# Patient Record
Sex: Female | Born: 1965 | Race: White | Hispanic: No | Marital: Married
Health system: Southern US, Community
[De-identification: ages and names within clinical notes are randomized; demographics above are authoritative.]

---

## 2004-04-27 ENCOUNTER — Emergency Department: Payer: Self-pay | Admitting: Unknown Physician Specialty

## 2004-05-29 ENCOUNTER — Emergency Department: Payer: Self-pay | Admitting: Emergency Medicine

## 2004-08-07 ENCOUNTER — Emergency Department: Payer: Self-pay | Admitting: Internal Medicine

## 2005-05-27 ENCOUNTER — Emergency Department: Payer: Self-pay | Admitting: Emergency Medicine

## 2006-02-06 ENCOUNTER — Emergency Department: Payer: Self-pay

## 2006-11-02 ENCOUNTER — Emergency Department: Payer: Self-pay | Admitting: Emergency Medicine

## 2006-11-17 ENCOUNTER — Ambulatory Visit: Payer: Self-pay | Admitting: Internal Medicine

## 2007-03-25 ENCOUNTER — Emergency Department: Payer: Self-pay | Admitting: Emergency Medicine

## 2007-04-29 ENCOUNTER — Emergency Department: Payer: Self-pay

## 2007-05-01 ENCOUNTER — Emergency Department: Payer: Self-pay | Admitting: Emergency Medicine

## 2007-05-01 ENCOUNTER — Other Ambulatory Visit: Payer: Self-pay

## 2007-05-02 ENCOUNTER — Other Ambulatory Visit: Payer: Self-pay

## 2007-05-23 ENCOUNTER — Emergency Department: Payer: Self-pay | Admitting: Emergency Medicine

## 2007-08-18 ENCOUNTER — Emergency Department: Payer: Self-pay | Admitting: Emergency Medicine

## 2007-08-18 ENCOUNTER — Other Ambulatory Visit: Payer: Self-pay

## 2007-11-03 ENCOUNTER — Emergency Department: Payer: Self-pay | Admitting: Emergency Medicine

## 2007-11-03 ENCOUNTER — Other Ambulatory Visit: Payer: Self-pay

## 2007-11-09 ENCOUNTER — Emergency Department: Payer: Self-pay | Admitting: Internal Medicine

## 2008-01-20 ENCOUNTER — Emergency Department: Payer: Self-pay | Admitting: Emergency Medicine

## 2008-02-17 ENCOUNTER — Emergency Department: Payer: Self-pay | Admitting: Emergency Medicine

## 2008-04-24 IMAGING — CR DG KNEE COMPLETE 4+V*R*
1 series · 5 of 5 positions shown · non-contrast
Comparison: none

REASON FOR EXAM: fall
COMMENTS:

PROCEDURE:     DXR - DXR KNEE RT COMP WITH OBLIQUES  - May 23, 2007  [DATE]
RESULT:     No fracture, dislocation or other acute bony abnormality is
identified.  The knee joint space is well maintained.  The patella is
intact. Incidental note is  made of slight dorsal patella spurring.

[Series 1: view not recorded · 0.17mm/px · 5 of 5 slices shown]
[im 1/5]
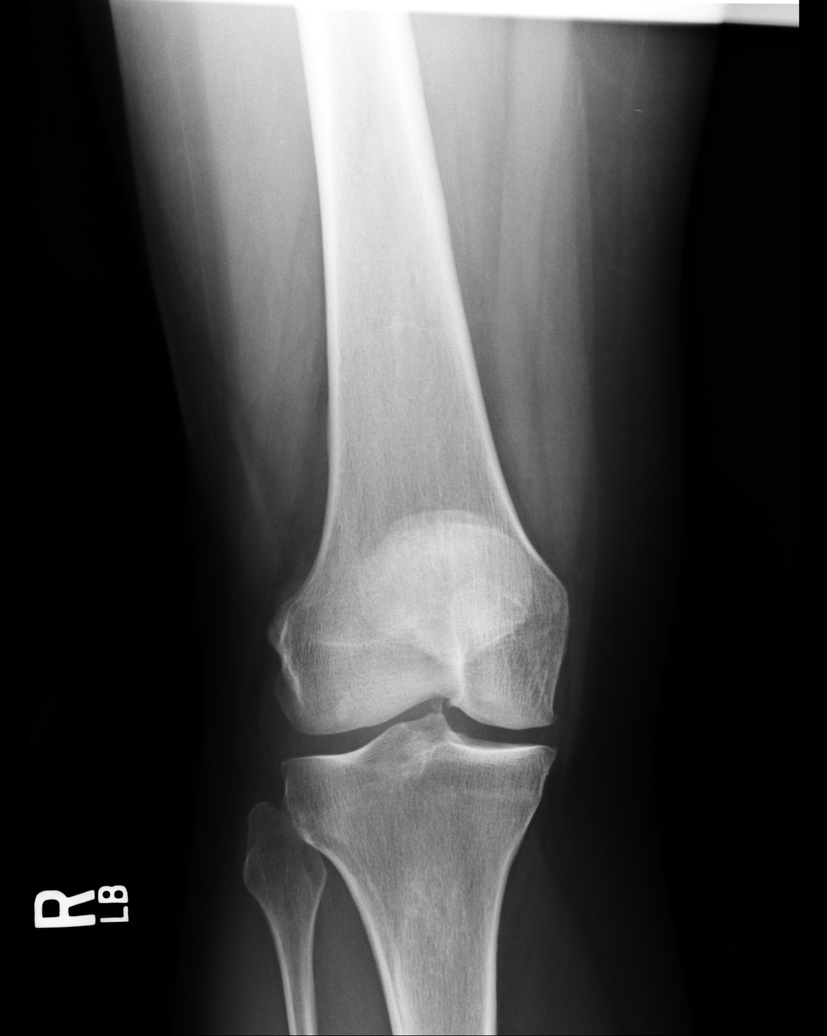
[im 2/5]
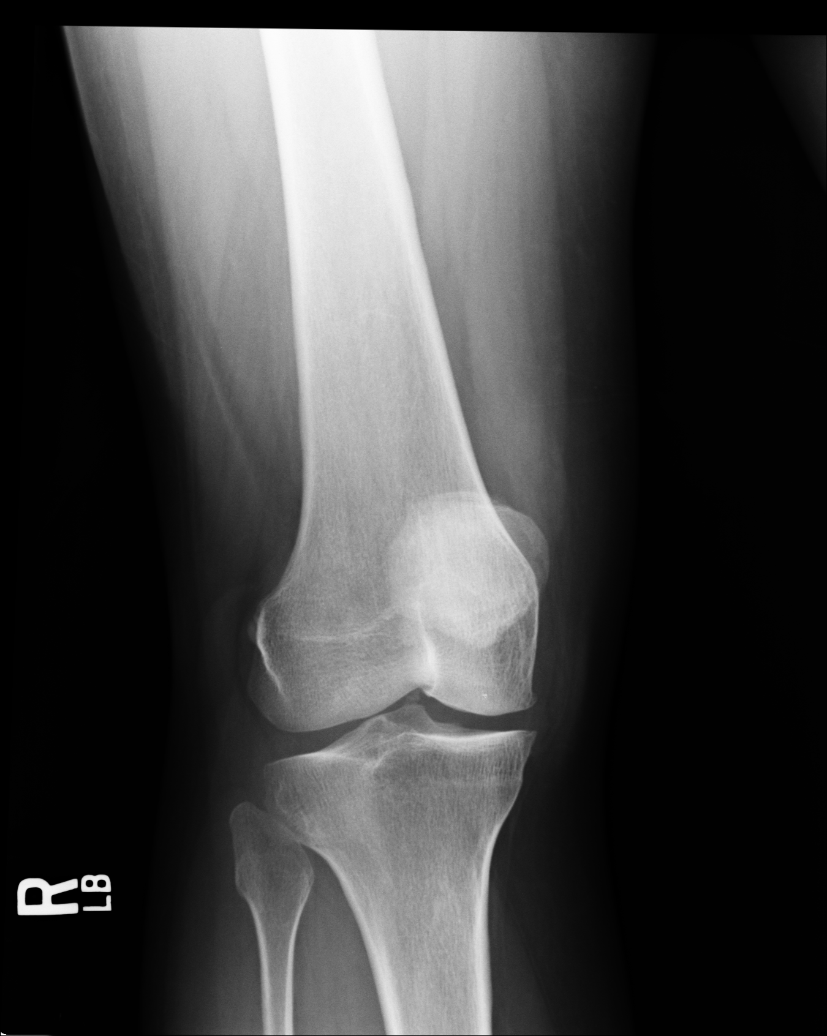
[im 3/5]
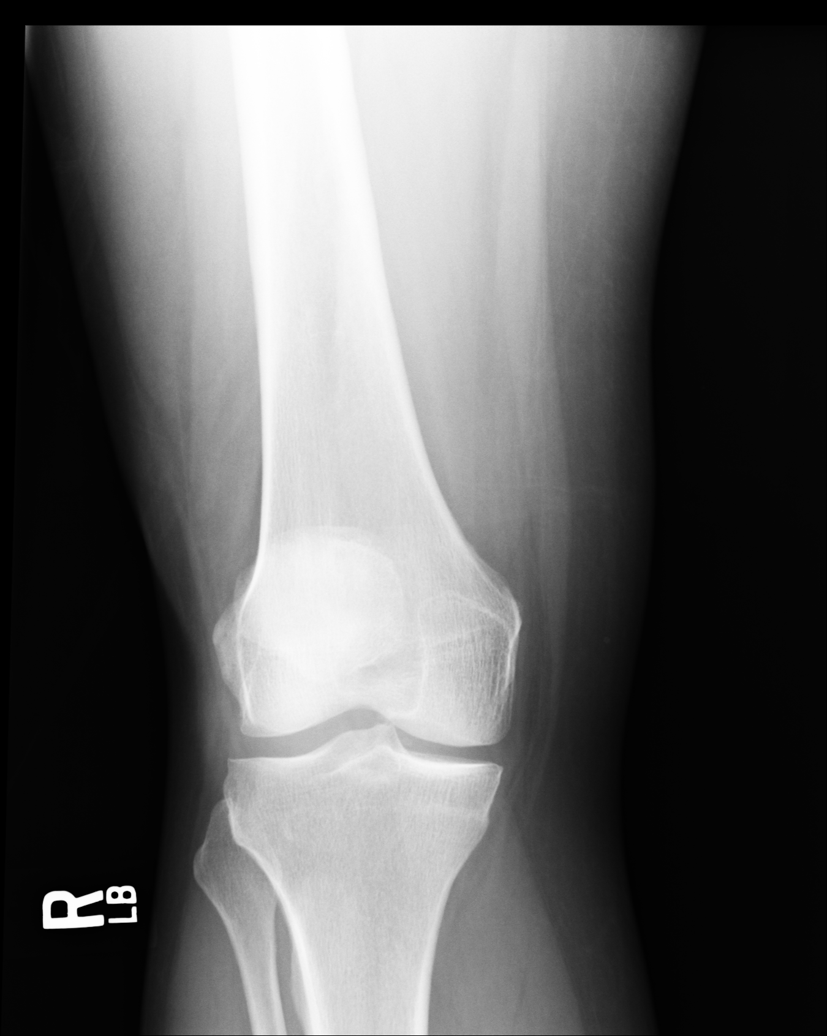
[im 4/5]
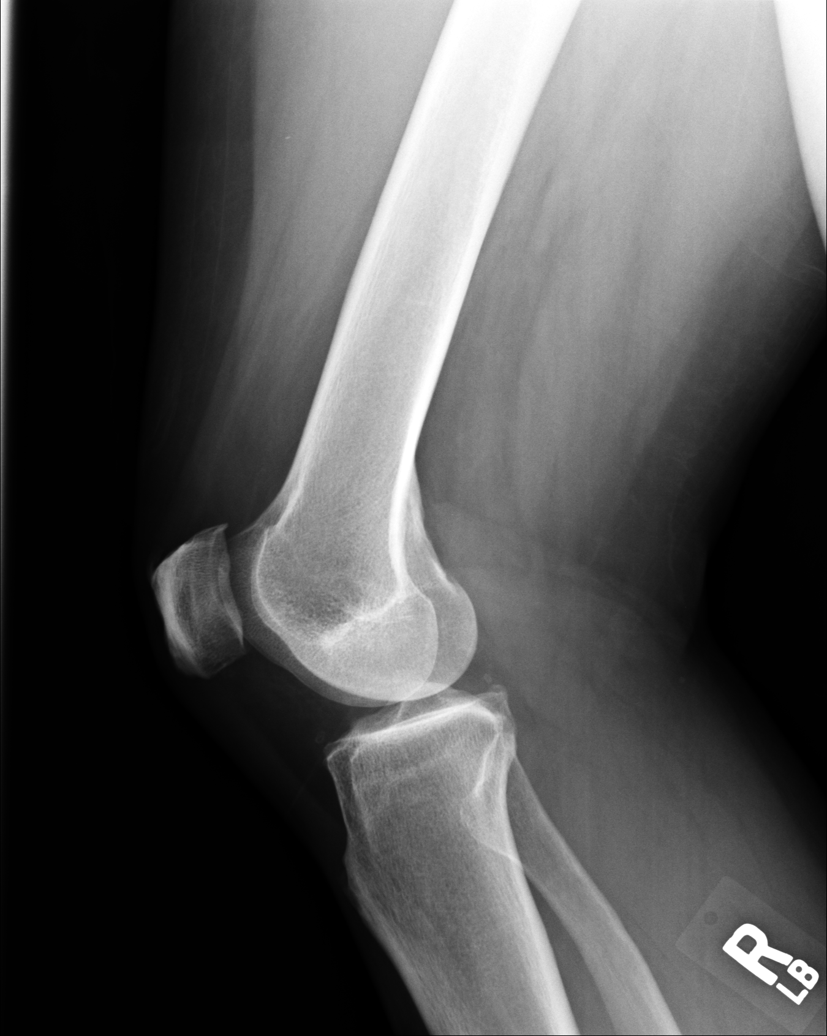
[im 5/5]
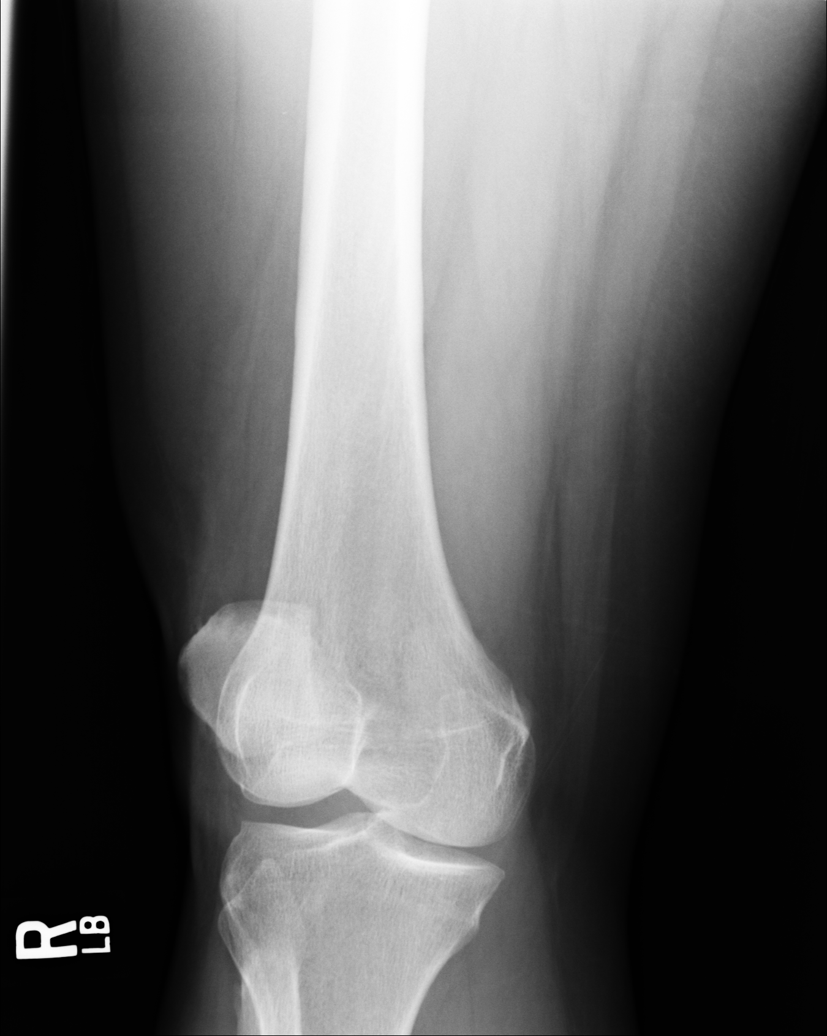

[5 of 5 positions shown; findings below may reference images not displayed]

IMPRESSION: 1.     No acute bony abnormalities are noted.
2.     There is slight dorsal patella spurring.

## 2008-04-24 IMAGING — CR DG SHOULDER 3+V*L*
1 series · 3 of 3 positions shown · non-contrast
Comparison: none

REASON FOR EXAM: fall
COMMENTS:

PROCEDURE:     DXR - DXR SHOULDER LEFT COMPLETE  - May 23, 2007  [DATE]
RESULT:     No fracture, dislocation or other acute bony abnormality is
identified.

[Series 1: view not recorded · 0.17mm/px · 3 of 3 slices shown]
[im 1/3]
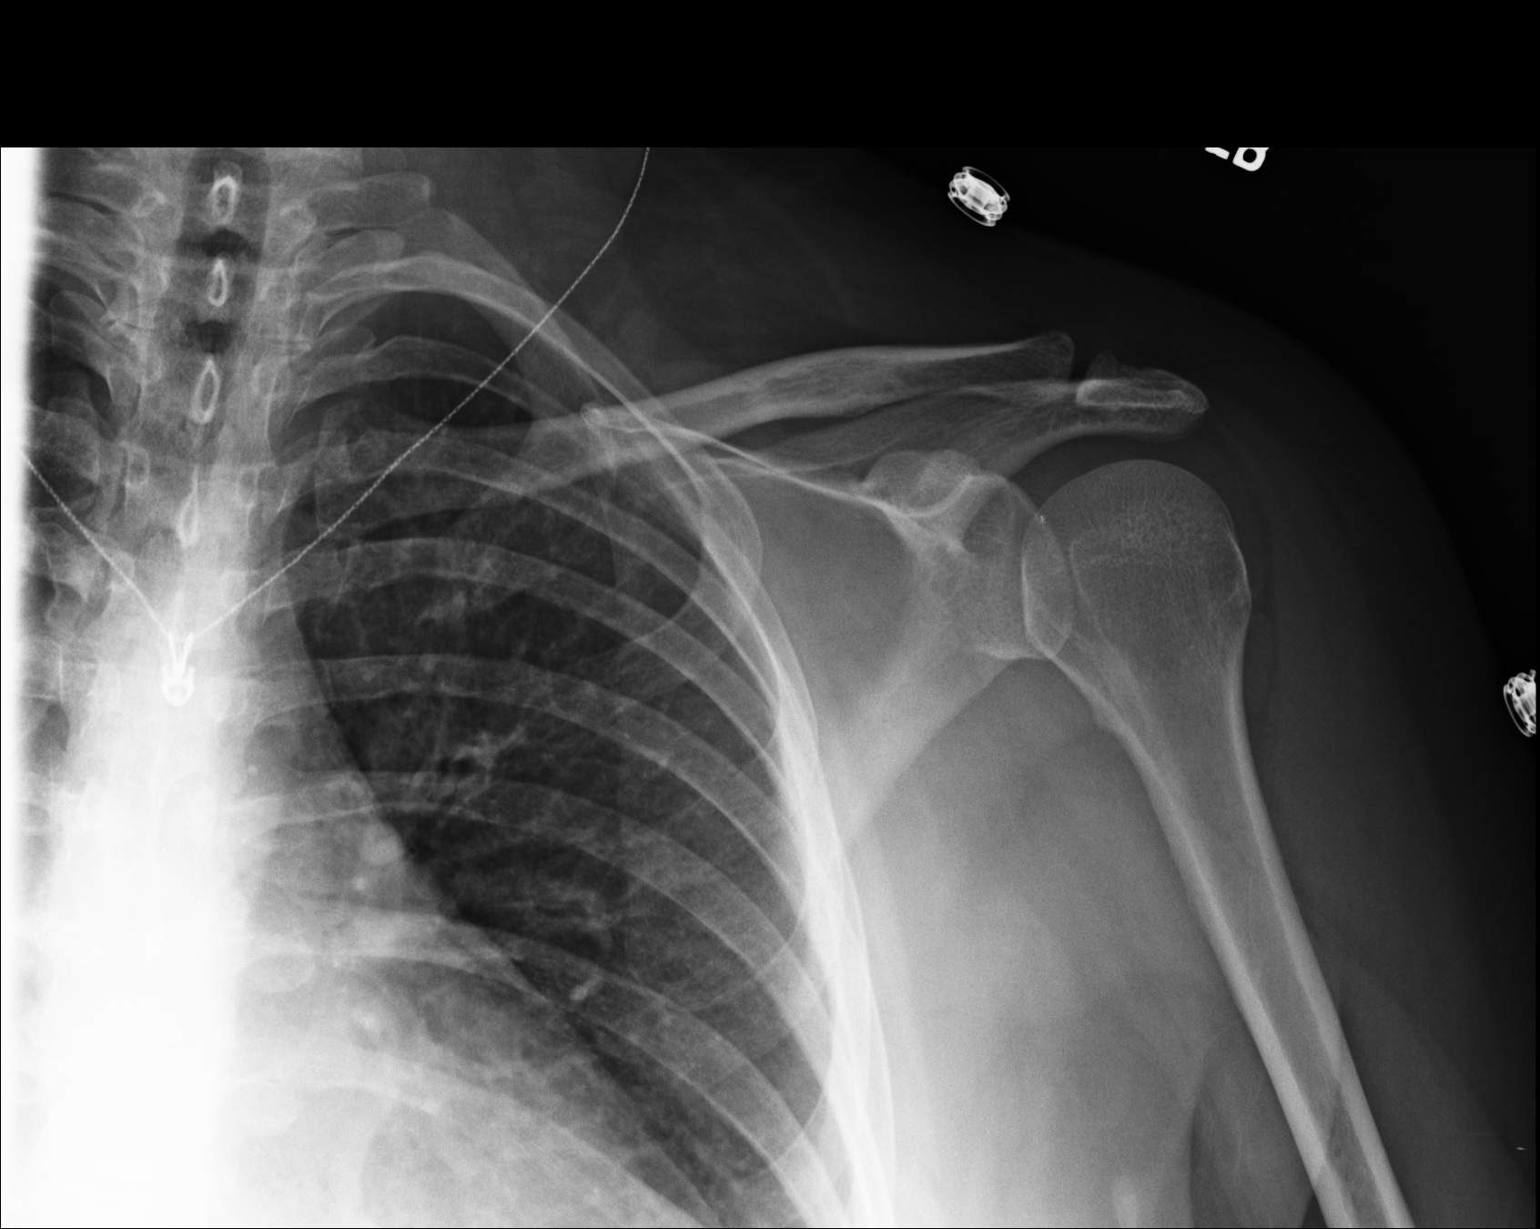
[im 2/3]
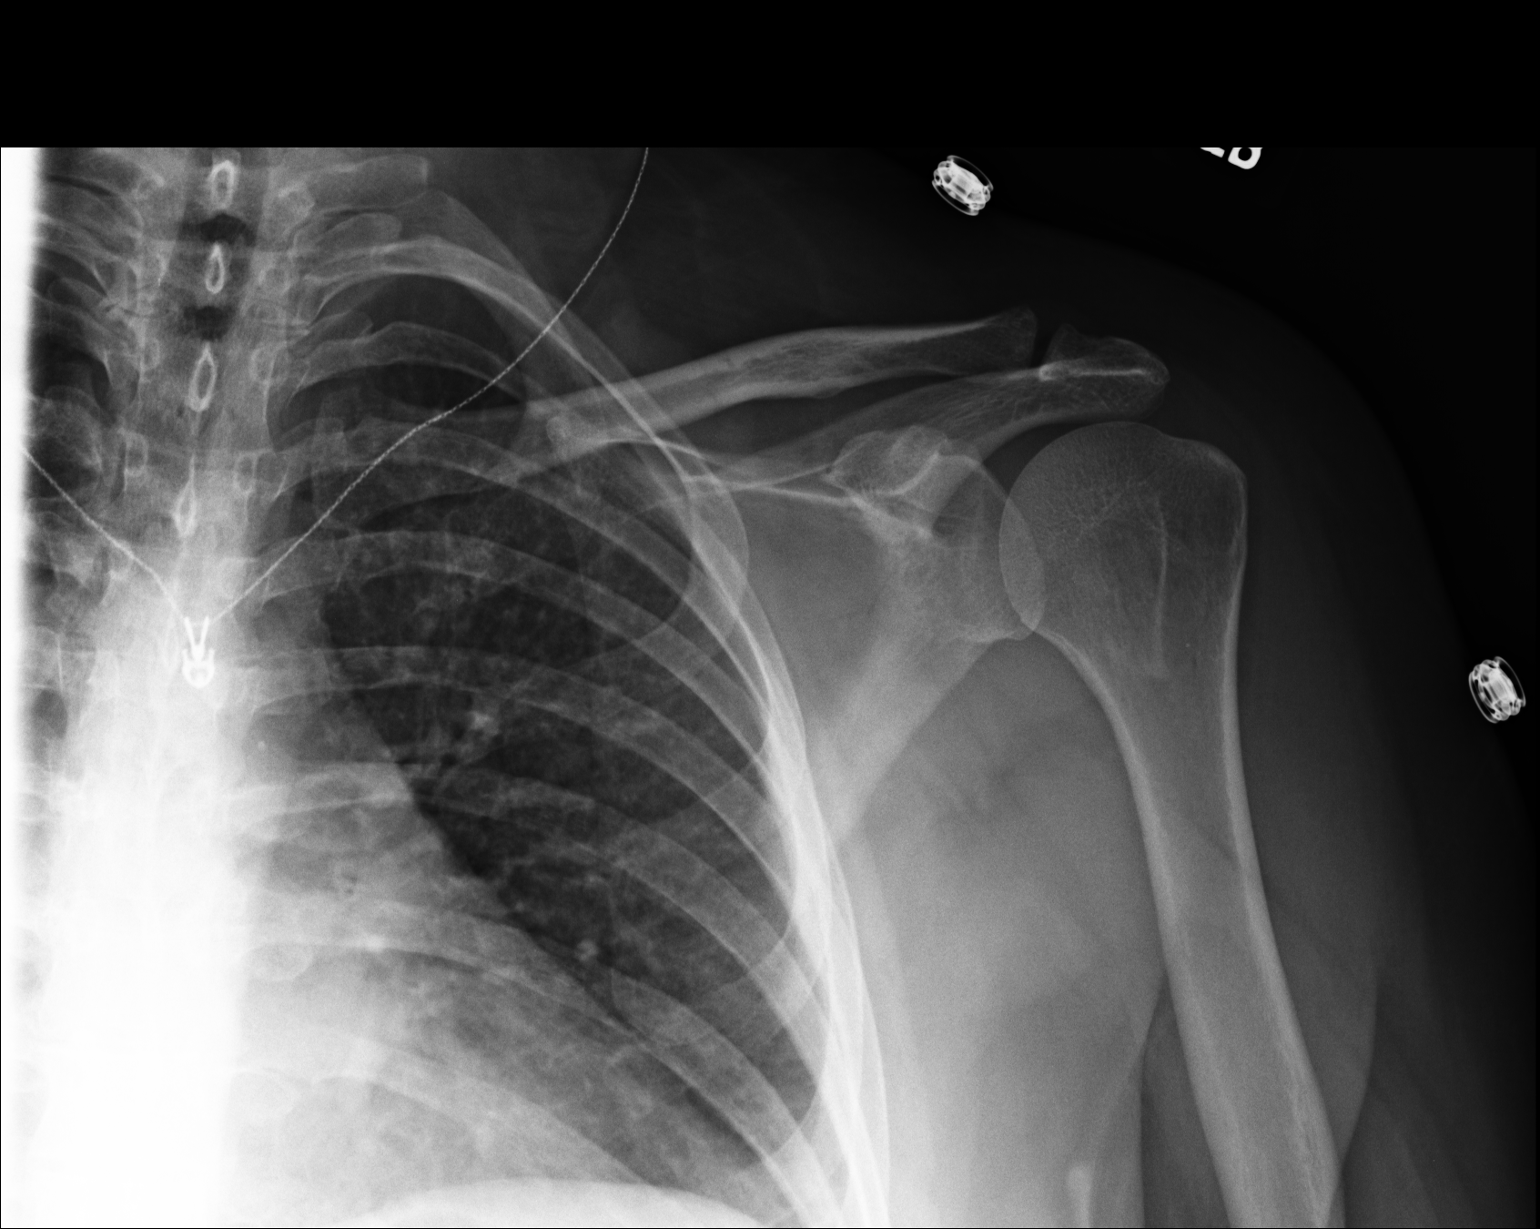
[im 3/3]
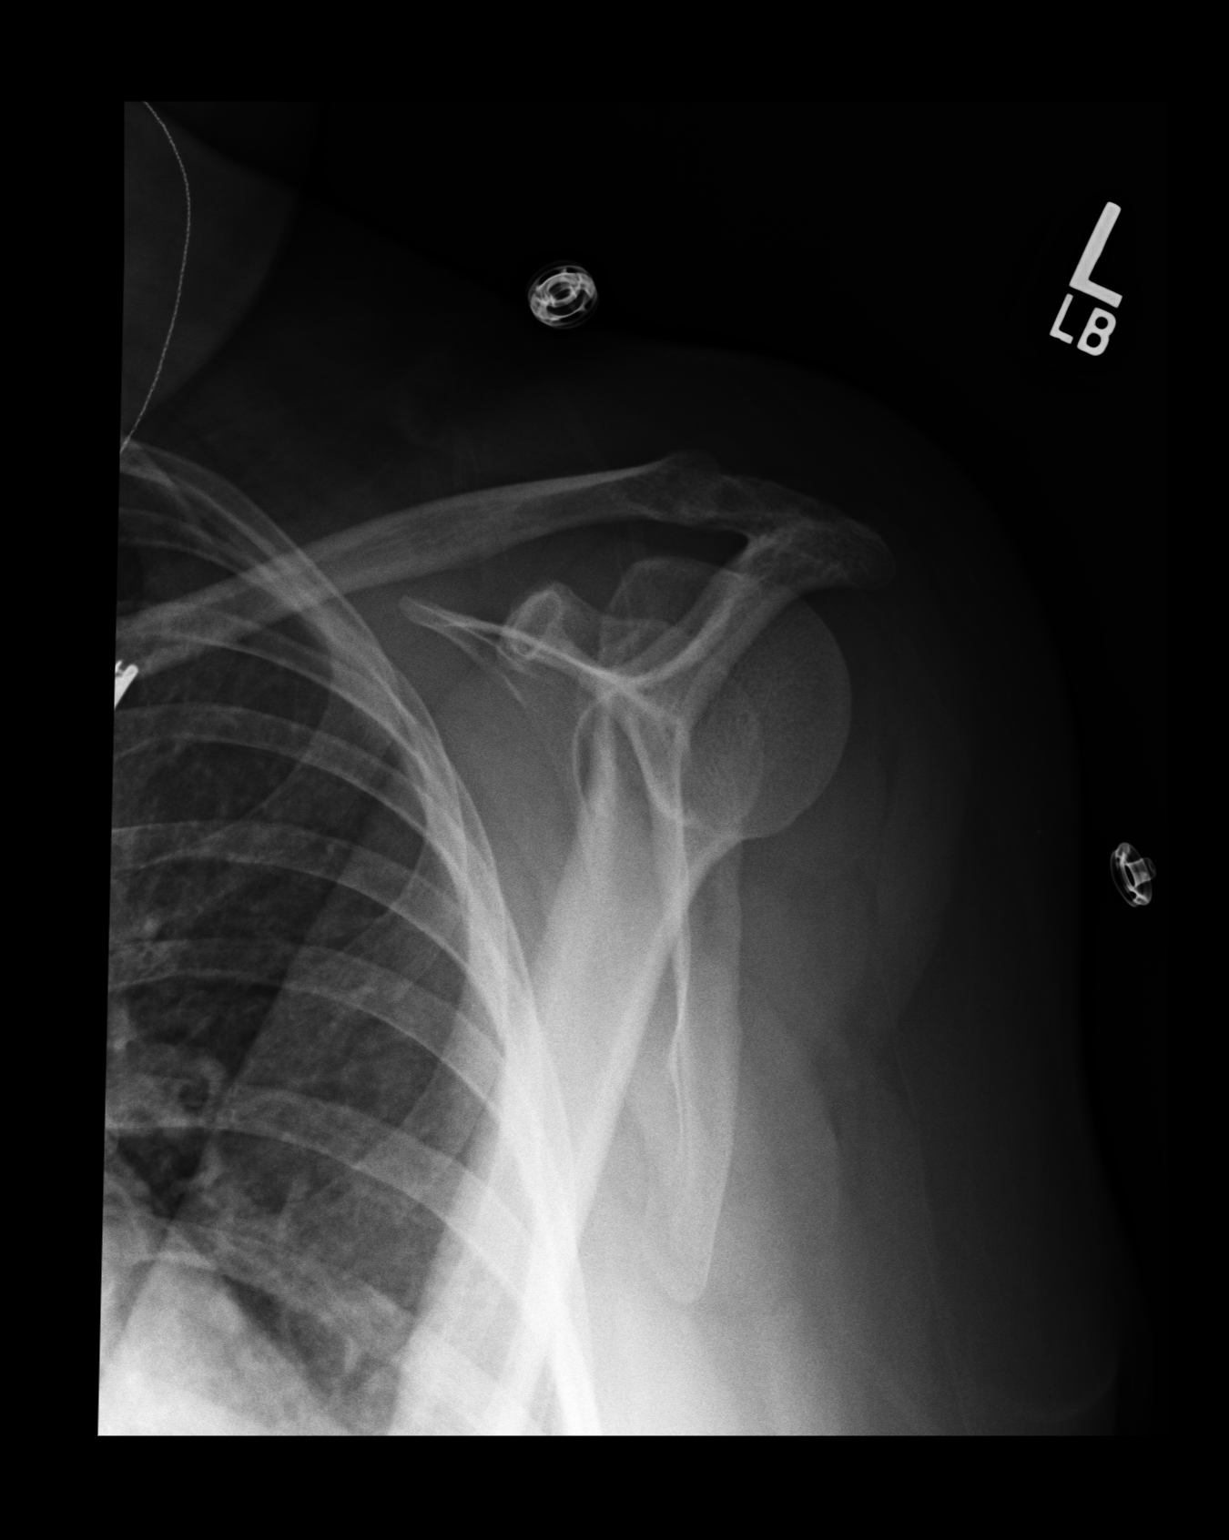

[3 of 3 positions shown; findings below may reference images not displayed]

IMPRESSION: No significant osseous abnormalities are noted.

## 2008-05-26 ENCOUNTER — Emergency Department: Payer: Self-pay | Admitting: Internal Medicine

## 2008-06-15 ENCOUNTER — Emergency Department: Payer: Self-pay | Admitting: Emergency Medicine

## 2008-07-22 ENCOUNTER — Emergency Department: Payer: Self-pay | Admitting: Internal Medicine

## 2008-08-18 ENCOUNTER — Emergency Department: Payer: Self-pay | Admitting: Emergency Medicine

## 2008-09-02 ENCOUNTER — Emergency Department: Payer: Self-pay | Admitting: Emergency Medicine

## 2008-11-21 ENCOUNTER — Emergency Department: Payer: Self-pay | Admitting: Emergency Medicine

## 2009-02-26 ENCOUNTER — Emergency Department: Payer: Self-pay | Admitting: Emergency Medicine

## 2009-03-19 ENCOUNTER — Inpatient Hospital Stay: Payer: Self-pay | Admitting: Specialist

## 2009-08-01 ENCOUNTER — Inpatient Hospital Stay: Payer: Self-pay | Admitting: Unknown Physician Specialty

## 2009-08-10 ENCOUNTER — Ambulatory Visit: Payer: Self-pay | Admitting: Unknown Physician Specialty

## 2009-08-31 ENCOUNTER — Ambulatory Visit: Payer: Self-pay | Admitting: Unknown Physician Specialty

## 2009-09-23 ENCOUNTER — Ambulatory Visit: Payer: Self-pay | Admitting: Cardiovascular Disease

## 2009-09-23 ENCOUNTER — Inpatient Hospital Stay: Payer: Self-pay | Admitting: Specialist

## 2009-10-01 ENCOUNTER — Ambulatory Visit: Payer: Self-pay | Admitting: Unknown Physician Specialty

## 2009-10-04 ENCOUNTER — Inpatient Hospital Stay: Payer: Self-pay | Admitting: Unknown Physician Specialty

## 2009-10-31 ENCOUNTER — Ambulatory Visit: Payer: Self-pay | Admitting: Unknown Physician Specialty

## 2010-04-24 ENCOUNTER — Emergency Department: Payer: Self-pay | Admitting: Emergency Medicine

## 2011-01-24 ENCOUNTER — Emergency Department: Payer: Self-pay | Admitting: Emergency Medicine

## 2011-02-01 ENCOUNTER — Inpatient Hospital Stay: Payer: Self-pay | Admitting: Internal Medicine

## 2011-02-25 ENCOUNTER — Emergency Department: Payer: Self-pay | Admitting: Emergency Medicine

## 2011-03-15 ENCOUNTER — Emergency Department: Payer: Self-pay | Admitting: *Deleted

## 2011-03-15 ENCOUNTER — Ambulatory Visit: Payer: Self-pay | Admitting: Internal Medicine

## 2011-03-23 ENCOUNTER — Ambulatory Visit: Payer: Self-pay | Admitting: Specialist

## 2011-04-07 ENCOUNTER — Ambulatory Visit: Payer: Self-pay | Admitting: Specialist

## 2011-06-10 ENCOUNTER — Observation Stay: Payer: Self-pay | Admitting: Internal Medicine

## 2011-07-14 ENCOUNTER — Observation Stay: Payer: Self-pay | Admitting: Internal Medicine

## 2011-07-14 LAB — URINALYSIS, COMPLETE
Squamous Epithelial: 1
WBC UR: 27 /HPF (ref 0–5)

## 2011-07-14 LAB — COMPREHENSIVE METABOLIC PANEL
Alkaline Phosphatase: 124 U/L (ref 50–136)
BUN: 5 mg/dL — ABNORMAL LOW (ref 7–18)
Calcium, Total: 8.2 mg/dL — ABNORMAL LOW (ref 8.5–10.1)
Chloride: 107 mmol/L (ref 98–107)
Creatinine: 0.63 mg/dL (ref 0.60–1.30)
EGFR (African American): >60
Glucose: 82 mg/dL (ref 65–99)
SGOT(AST): 20 U/L (ref 15–37)
SGPT (ALT): 18 U/L
Total Protein: 7.4 g/dL (ref 6.4–8.2)

## 2011-07-14 LAB — ACETAMINOPHEN LEVEL: Acetaminophen: <2 ug/mL

## 2011-07-14 LAB — DRUG SCREEN, URINE
Amphetamines, Ur Screen: NEGATIVE (ref ?–1000)
Benzodiazepine, Ur Scrn: NEGATIVE (ref ?–200)
Cannabinoid 50 Ng, Ur ~~LOC~~: NEGATIVE (ref ?–50)
Cocaine Metabolite,Ur ~~LOC~~: POSITIVE (ref ?–300)
Methadone, Ur Screen: NEGATIVE (ref ?–300)
Opiate, Ur Screen: NEGATIVE (ref ?–300)
Phencyclidine (PCP) Ur S: NEGATIVE (ref ?–25)

## 2011-07-14 LAB — ETHANOL: Ethanol %: <0.003 % (ref 0.000–0.080)

## 2011-07-14 LAB — CBC
HCT: 42.6 % (ref 35.0–47.0)
HGB: 13.8 g/dL (ref 12.0–16.0)
MCHC: 32.3 g/dL (ref 32.0–36.0)
RBC: 5.23 10*6/uL — ABNORMAL HIGH (ref 3.80–5.20)
RDW: 20.2 % — ABNORMAL HIGH (ref 11.5–14.5)
WBC: 13.3 10*3/uL — ABNORMAL HIGH (ref 3.6–11.0)

## 2011-07-14 LAB — TSH: Thyroid Stimulating Horm: 1.58 u[IU]/mL

## 2011-07-14 LAB — PROTIME-INR
INR: 1.1
Prothrombin Time: 14.6 secs (ref 11.5–14.7)

## 2011-07-14 LAB — PREGNANCY, URINE: Pregnancy Test, Urine: NEGATIVE m[IU]/mL

## 2011-07-14 LAB — SALICYLATE LEVEL: Salicylates, Serum: 5 mg/dL — ABNORMAL HIGH

## 2011-07-15 LAB — BASIC METABOLIC PANEL
Anion Gap: 11 (ref 7–16)
Calcium, Total: 7.7 mg/dL — ABNORMAL LOW (ref 8.5–10.1)
Chloride: 106 mmol/L (ref 98–107)
Co2: 24 mmol/L (ref 21–32)
Creatinine: 0.69 mg/dL (ref 0.60–1.30)
EGFR (African American): >60
Potassium: 3.8 mmol/L (ref 3.5–5.1)

## 2011-07-15 LAB — HEMOGLOBIN A1C: Hemoglobin A1C: 8.1 % — ABNORMAL HIGH (ref 4.2–6.3)

## 2011-07-15 LAB — CBC WITH DIFFERENTIAL/PLATELET
Basophil #: 0.2 10*3/uL — ABNORMAL HIGH (ref 0.0–0.1)
HGB: 12.5 g/dL (ref 12.0–16.0)
Lymphocyte #: 2.9 10*3/uL (ref 1.0–3.6)
Lymphocyte %: 24.7 %
MCH: 26.7 pg (ref 26.0–34.0)
MCHC: 32.7 g/dL (ref 32.0–36.0)
Monocyte %: 3.8 %
Neutrophil %: 67.9 %
Platelet: 352 10*3/uL (ref 150–440)
RBC: 4.66 10*6/uL (ref 3.80–5.20)
RDW: 19.1 % — ABNORMAL HIGH (ref 11.5–14.5)

## 2011-07-15 LAB — MAGNESIUM: Magnesium: 0.9 mg/dL — ABNORMAL LOW

## 2011-07-15 LAB — PROTIME-INR
INR: 1.2
Prothrombin Time: 15.3 secs — ABNORMAL HIGH (ref 11.5–14.7)

## 2011-07-15 LAB — URINE CULTURE

## 2011-07-16 LAB — BASIC METABOLIC PANEL
BUN: 5 mg/dL — ABNORMAL LOW (ref 7–18)
Calcium, Total: 8.1 mg/dL — ABNORMAL LOW (ref 8.5–10.1)
Co2: 30 mmol/L (ref 21–32)
Creatinine: 0.64 mg/dL (ref 0.60–1.30)
EGFR (African American): >60
EGFR (Non-African Amer.): >60
Glucose: 77 mg/dL (ref 65–99)
Potassium: 3.7 mmol/L (ref 3.5–5.1)
Sodium: 144 mmol/L (ref 136–145)

## 2011-07-16 LAB — CBC WITH DIFFERENTIAL/PLATELET
Basophil #: 0 10*3/uL (ref 0.0–0.1)
Basophil %: 0.5 %
Eosinophil #: 0.2 10*3/uL (ref 0.0–0.7)
Eosinophil %: 2.2 %
HCT: 40.4 % (ref 35.0–47.0)
HGB: 12.6 g/dL (ref 12.0–16.0)
Lymphocyte #: 2.7 10*3/uL (ref 1.0–3.6)
Lymphocyte %: 25.1 %
MCH: 25.8 pg — ABNORMAL LOW (ref 26.0–34.0)
MCHC: 31.3 g/dL — ABNORMAL LOW (ref 32.0–36.0)
MCV: 83 fL (ref 80–100)
Monocyte #: 0.5 10*3/uL (ref 0.0–0.7)
Monocyte %: 4.2 %
Neutrophil #: 7.4 10*3/uL — ABNORMAL HIGH (ref 1.4–6.5)
Neutrophil %: 68 %
Platelet: 364 10*3/uL (ref 150–440)
RBC: 4.89 10*6/uL (ref 3.80–5.20)
RDW: 20 % — ABNORMAL HIGH (ref 11.5–14.5)
WBC: 10.9 10*3/uL (ref 3.6–11.0)

## 2011-07-16 LAB — PROTIME-INR: Prothrombin Time: 16.6 secs — ABNORMAL HIGH (ref 11.5–14.7)

## 2011-07-17 LAB — PROTIME-INR: INR: 1.3

## 2011-07-18 LAB — PROTIME-INR
INR: 1.5
Prothrombin Time: 18.3 secs — ABNORMAL HIGH (ref 11.5–14.7)

## 2011-07-19 LAB — LIPID PANEL
Ldl Cholesterol, Calc: 130 mg/dL — ABNORMAL HIGH (ref 0–100)
VLDL Cholesterol, Calc: 60 mg/dL — ABNORMAL HIGH (ref 5–40)

## 2011-07-19 LAB — GLUCOSE, RANDOM: Glucose: 129 mg/dL — ABNORMAL HIGH (ref 65–99)

## 2011-07-25 ENCOUNTER — Ambulatory Visit: Payer: Self-pay | Admitting: Unknown Physician Specialty

## 2011-07-26 ENCOUNTER — Emergency Department: Payer: Self-pay | Admitting: Emergency Medicine

## 2011-07-26 LAB — COMPREHENSIVE METABOLIC PANEL
Albumin: 3.4 g/dL (ref 3.4–5.0)
Alkaline Phosphatase: 119 U/L (ref 50–136)
Anion Gap: 16 (ref 7–16)
Bilirubin,Total: 0.3 mg/dL (ref 0.2–1.0)
Calcium, Total: 9 mg/dL (ref 8.5–10.1)
Chloride: 101 mmol/L (ref 98–107)
Co2: 25 mmol/L (ref 21–32)
EGFR (Non-African Amer.): >60
Osmolality: 287 (ref 275–301)
Potassium: 3.7 mmol/L (ref 3.5–5.1)
SGPT (ALT): 28 U/L
Sodium: 142 mmol/L (ref 136–145)

## 2011-07-26 LAB — CBC
HCT: 43 % (ref 35.0–47.0)
HGB: 13.9 g/dL (ref 12.0–16.0)
MCV: 82 fL (ref 80–100)
RBC: 5.25 10*6/uL — ABNORMAL HIGH (ref 3.80–5.20)
WBC: 16.8 10*3/uL — ABNORMAL HIGH (ref 3.6–11.0)

## 2011-07-26 LAB — DRUG SCREEN, URINE
Barbiturates, Ur Screen: NEGATIVE (ref ?–200)
Benzodiazepine, Ur Scrn: NEGATIVE (ref ?–200)
Cannabinoid 50 Ng, Ur ~~LOC~~: NEGATIVE (ref ?–50)
Cocaine Metabolite,Ur ~~LOC~~: POSITIVE (ref ?–300)
MDMA (Ecstasy)Ur Screen: NEGATIVE (ref ?–500)
Methadone, Ur Screen: NEGATIVE (ref ?–300)
Opiate, Ur Screen: NEGATIVE (ref ?–300)
Phencyclidine (PCP) Ur S: NEGATIVE (ref ?–25)
Tricyclic, Ur Screen: NEGATIVE (ref ?–1000)

## 2011-07-26 LAB — TSH: Thyroid Stimulating Horm: 2.06 u[IU]/mL

## 2011-07-26 LAB — ETHANOL: Ethanol %: <0.003 % (ref 0.000–0.080)

## 2011-09-24 ENCOUNTER — Emergency Department: Payer: Self-pay | Admitting: *Deleted

## 2011-10-26 ENCOUNTER — Emergency Department: Payer: Self-pay | Admitting: Emergency Medicine

## 2011-10-26 LAB — COMPREHENSIVE METABOLIC PANEL
Albumin: 2.8 g/dL — ABNORMAL LOW (ref 3.4–5.0)
Alkaline Phosphatase: 109 U/L (ref 50–136)
Anion Gap: 12 (ref 7–16)
Calcium, Total: 8.2 mg/dL — ABNORMAL LOW (ref 8.5–10.1)
EGFR (African American): >60
Osmolality: 284 (ref 275–301)
Potassium: 3.7 mmol/L (ref 3.5–5.1)
SGOT(AST): 14 U/L — ABNORMAL LOW (ref 15–37)
Sodium: 137 mmol/L (ref 136–145)
Total Protein: 6.9 g/dL (ref 6.4–8.2)

## 2011-10-26 LAB — CBC
HCT: 36 % (ref 35.0–47.0)
HGB: 10.9 g/dL — ABNORMAL LOW (ref 12.0–16.0)
MCH: 23.7 pg — ABNORMAL LOW (ref 26.0–34.0)
MCV: 78 fL — ABNORMAL LOW (ref 80–100)
RBC: 4.59 10*6/uL (ref 3.80–5.20)
WBC: 13.4 10*3/uL — ABNORMAL HIGH (ref 3.6–11.0)

## 2011-10-26 LAB — CK TOTAL AND CKMB (NOT AT ARMC)
CK, Total: 58 U/L (ref 21–215)
CK-MB: 1.6 ng/mL (ref 0.5–3.6)

## 2011-10-26 LAB — TROPONIN I: Troponin-I: <0.02 ng/mL

## 2011-11-01 LAB — CULTURE, BLOOD (SINGLE)

## 2012-01-28 IMAGING — CR DG CHEST 2V
1 series · 2 of 2 positions shown · non-contrast
Comparison: none

REASON FOR EXAM: hx of blood clot
COMMENTS:

[Series 1: view not recorded · 0.17mm/px · 2 of 2 slices shown]
[im 1/2]
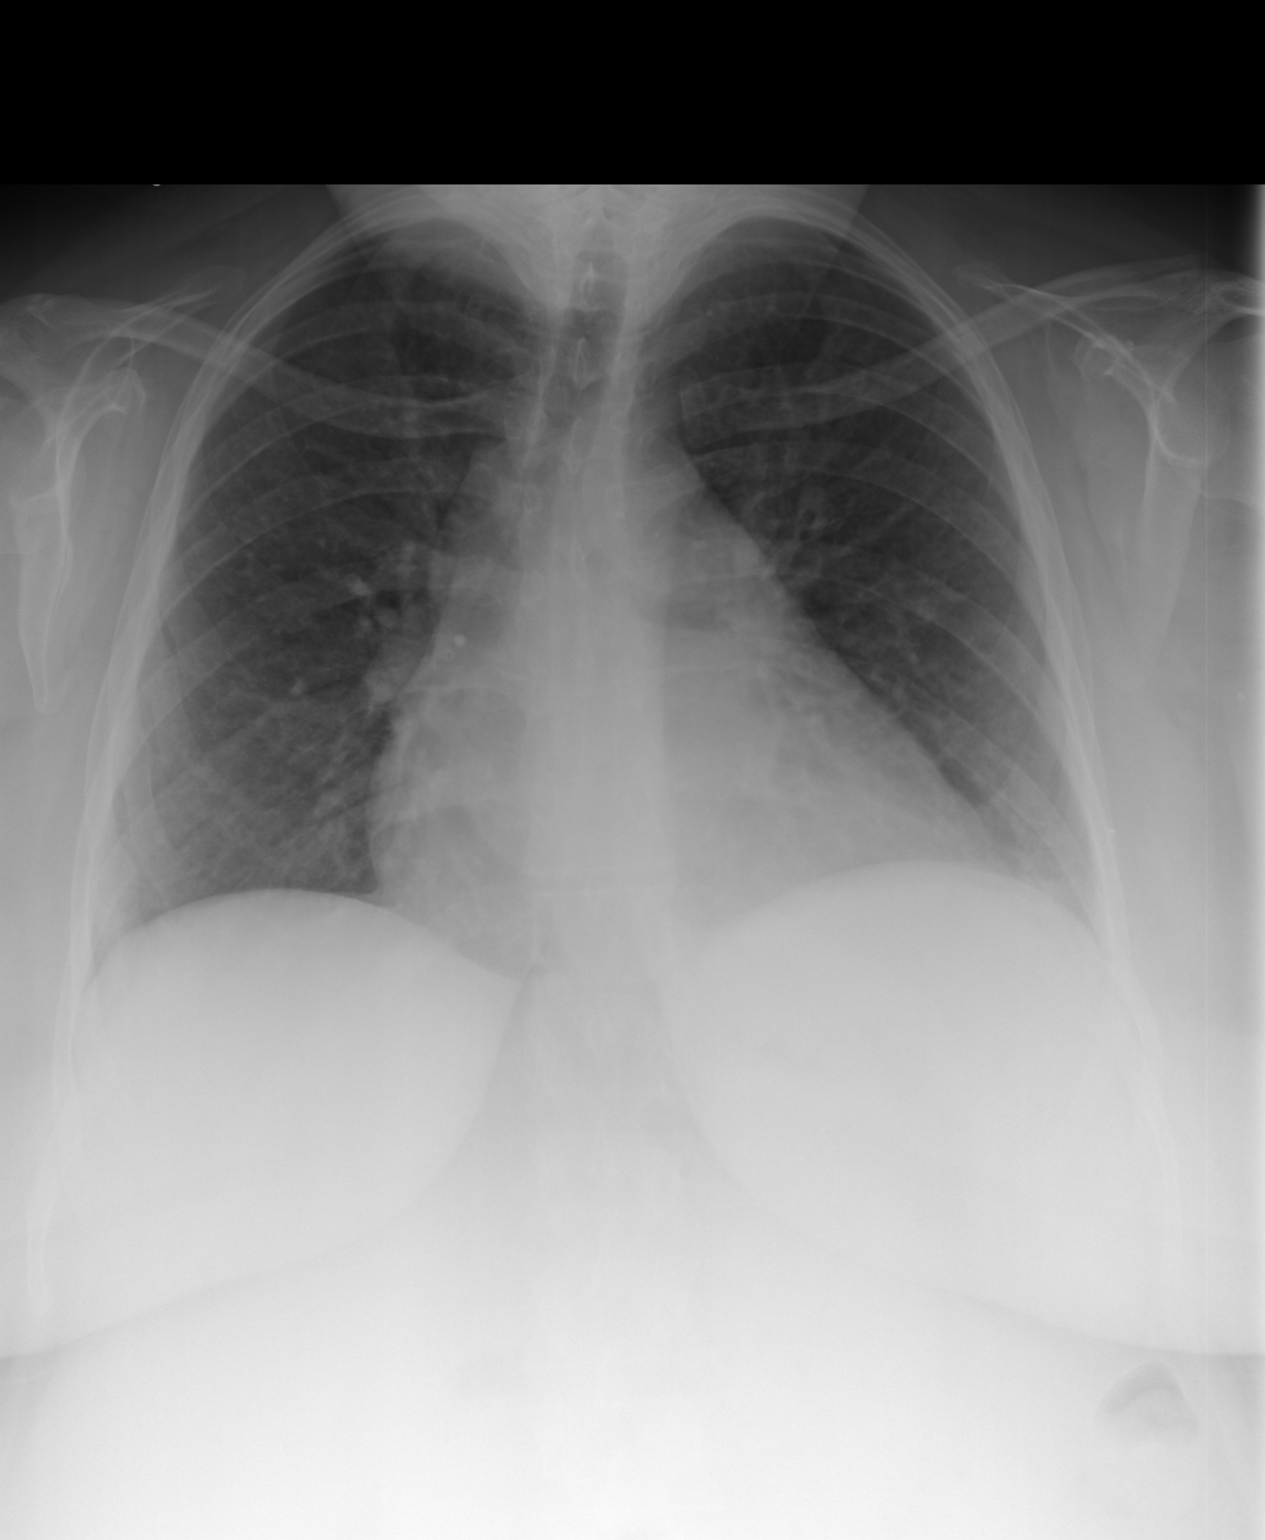
[im 2/2]
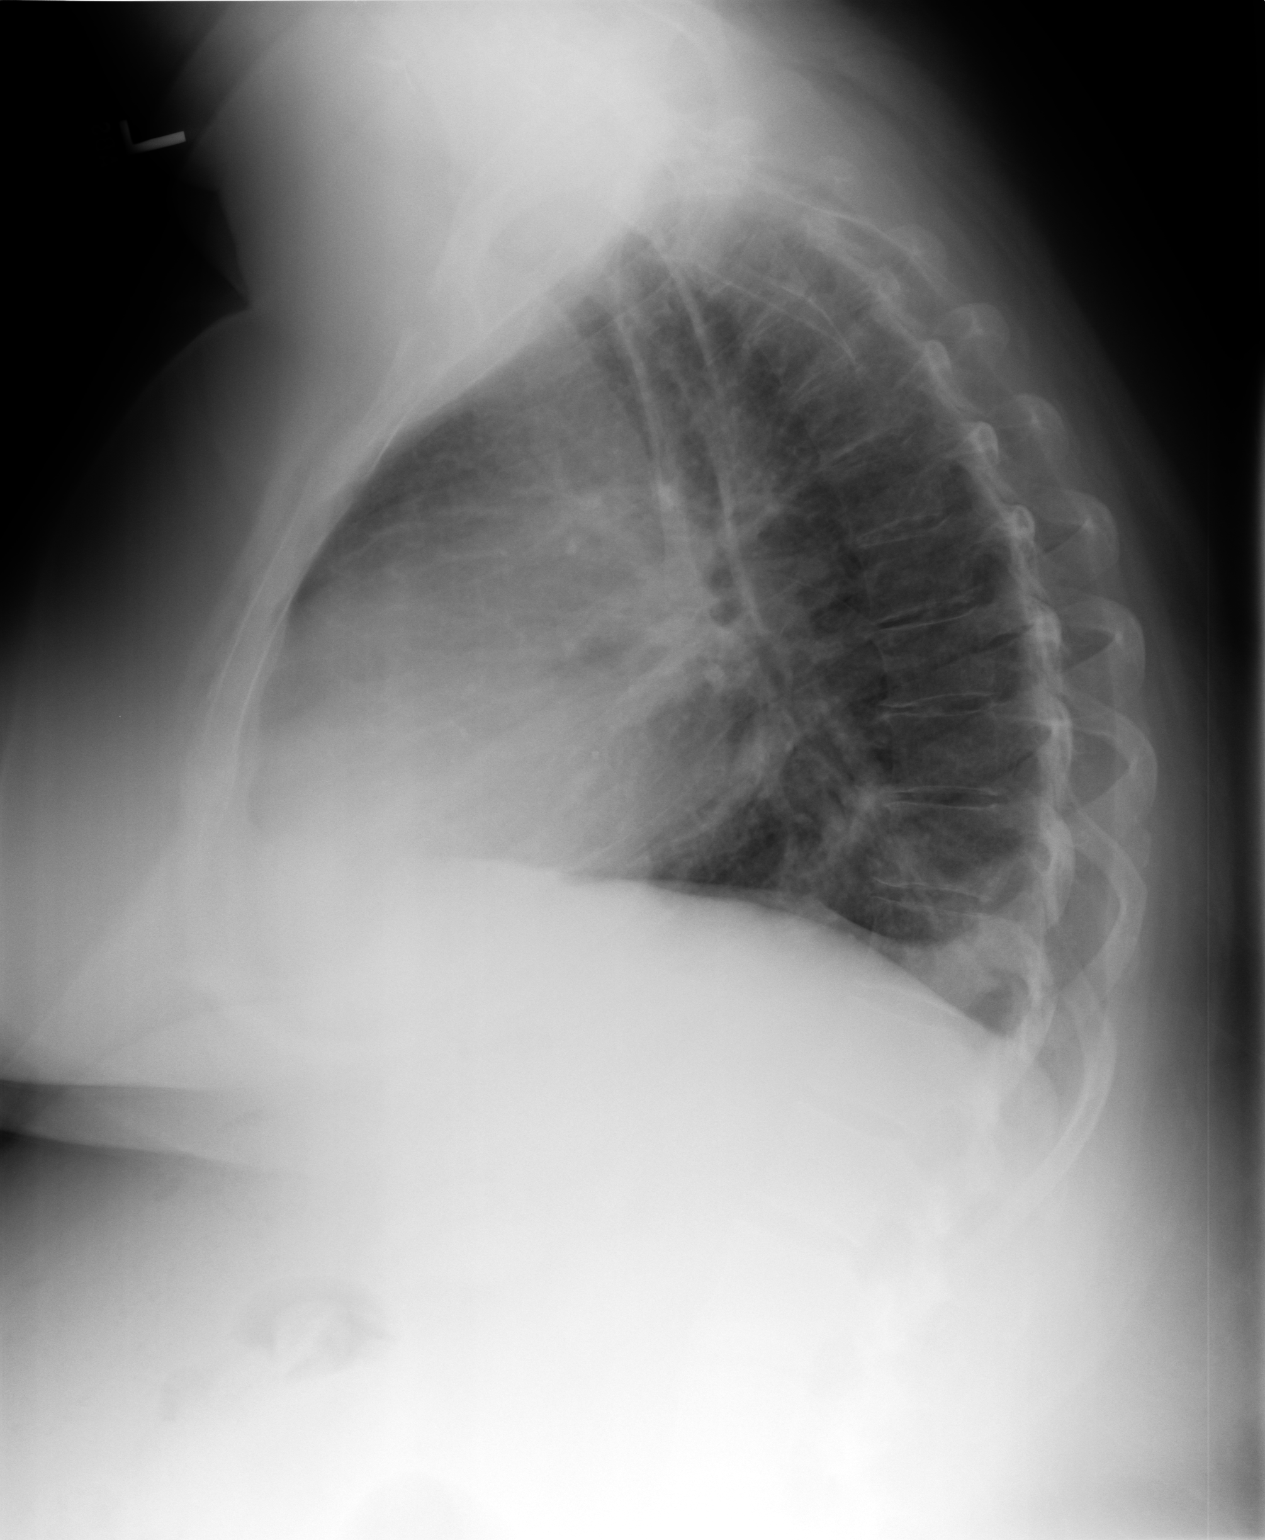

[2 of 2 positions shown; findings below may reference images not displayed]

PROCEDURE:     DXR - DXR CHEST PA (OR AP) AND LATERAL  - February 25, 2011  [DATE]

RESULT:     Comparison is made to the study 31 January, 2011.

The cardiac silhouette remains enlarged. The pulmonary vascularity is mildly
prominent. I see no pleural effusion. On the lateral film there is density
posteriorly in what appears to be the left lower lobe.
IMPRESSION: There is atelectasis or developing infiltrate and likely a
trace of fluid in the left lower lobe posteriorly. There may be low-grade
compensated CHF as well given the enlargement of the cardiac silhouette and
prominence of the pulmonary vascularity.

## 2012-03-23 ENCOUNTER — Emergency Department: Payer: Self-pay | Admitting: Emergency Medicine

## 2012-04-09 ENCOUNTER — Emergency Department: Payer: Self-pay | Admitting: Emergency Medicine

## 2012-04-09 LAB — CBC
HCT: 29.3 % — ABNORMAL LOW (ref 35.0–47.0)
HGB: 9.3 g/dL — ABNORMAL LOW (ref 12.0–16.0)
MCH: 23.6 pg — ABNORMAL LOW (ref 26.0–34.0)
MCHC: 31.7 g/dL — ABNORMAL LOW (ref 32.0–36.0)
MCV: 74 fL — ABNORMAL LOW (ref 80–100)
Platelet: 491 10*3/uL — ABNORMAL HIGH (ref 150–440)
RDW: 20.2 % — ABNORMAL HIGH (ref 11.5–14.5)
WBC: 11.1 10*3/uL — ABNORMAL HIGH (ref 3.6–11.0)

## 2012-04-09 LAB — COMPREHENSIVE METABOLIC PANEL
Albumin: 3.2 g/dL — ABNORMAL LOW (ref 3.4–5.0)
Alkaline Phosphatase: 111 U/L (ref 50–136)
BUN: 14 mg/dL (ref 7–18)
Bilirubin,Total: 0.1 mg/dL — ABNORMAL LOW (ref 0.2–1.0)
Co2: 24 mmol/L (ref 21–32)
Creatinine: 0.66 mg/dL (ref 0.60–1.30)
EGFR (Non-African Amer.): >60
Glucose: 90 mg/dL (ref 65–99)
Osmolality: 285 (ref 275–301)
SGOT(AST): 17 U/L (ref 15–37)
SGPT (ALT): 18 U/L (ref 12–78)
Sodium: 143 mmol/L (ref 136–145)

## 2012-06-17 ENCOUNTER — Inpatient Hospital Stay: Payer: Self-pay | Admitting: Internal Medicine

## 2012-06-17 LAB — COMPREHENSIVE METABOLIC PANEL
Albumin: 3 g/dL — ABNORMAL LOW (ref 3.4–5.0)
BUN: 13 mg/dL (ref 7–18)
Bilirubin,Total: 0.2 mg/dL (ref 0.2–1.0)
Chloride: 102 mmol/L (ref 98–107)
Creatinine: 0.73 mg/dL (ref 0.60–1.30)
EGFR (African American): >60
Glucose: 292 mg/dL — ABNORMAL HIGH (ref 65–99)
Osmolality: 283 (ref 275–301)
SGOT(AST): 29 U/L (ref 15–37)
SGPT (ALT): 27 U/L (ref 12–78)
Sodium: 136 mmol/L (ref 136–145)
Total Protein: 7.1 g/dL (ref 6.4–8.2)

## 2012-06-17 LAB — URINALYSIS, COMPLETE
Bilirubin,UR: NEGATIVE
Ketone: NEGATIVE
Ph: 6 (ref 4.5–8.0)
Protein: NEGATIVE
Specific Gravity: 1.013 (ref 1.003–1.030)
WBC UR: <1 /HPF (ref 0–5)

## 2012-06-17 LAB — DRUG SCREEN, URINE
Amphetamines, Ur Screen: NEGATIVE (ref ?–1000)
Barbiturates, Ur Screen: NEGATIVE (ref ?–200)
Benzodiazepine, Ur Scrn: POSITIVE (ref ?–200)
Cannabinoid 50 Ng, Ur ~~LOC~~: POSITIVE (ref ?–50)
Methadone, Ur Screen: NEGATIVE (ref ?–300)
Opiate, Ur Screen: POSITIVE (ref ?–300)
Tricyclic, Ur Screen: NEGATIVE (ref ?–1000)

## 2012-06-17 LAB — APTT: Activated PTT: 27.9 secs (ref 23.6–35.9)

## 2012-06-17 LAB — PROTIME-INR
INR: 0.9
Prothrombin Time: 12.1 secs (ref 11.5–14.7)

## 2012-06-17 LAB — CBC
MCH: 27.2 pg (ref 26.0–34.0)
MCHC: 32.8 g/dL (ref 32.0–36.0)
MCV: 83 fL (ref 80–100)
Platelet: 369 10*3/uL (ref 150–440)
RBC: 4.25 10*6/uL (ref 3.80–5.20)
RDW: 21.8 % — ABNORMAL HIGH (ref 11.5–14.5)

## 2012-06-17 LAB — CK TOTAL AND CKMB (NOT AT ARMC): CK-MB: 3.7 ng/mL — ABNORMAL HIGH (ref 0.5–3.6)

## 2012-06-17 LAB — TROPONIN I: Troponin-I: <0.02 ng/mL

## 2012-06-17 LAB — VALPROIC ACID LEVEL: Valproic Acid: <3 ug/mL — ABNORMAL LOW

## 2012-06-17 LAB — SALICYLATE LEVEL: Salicylates, Serum: 27.7 mg/dL — ABNORMAL HIGH

## 2012-06-18 LAB — COMPREHENSIVE METABOLIC PANEL
Alkaline Phosphatase: 109 U/L (ref 50–136)
Anion Gap: 3 — ABNORMAL LOW (ref 7–16)
Bilirubin,Total: 0.1 mg/dL — ABNORMAL LOW (ref 0.2–1.0)
Calcium, Total: 8.3 mg/dL — ABNORMAL LOW (ref 8.5–10.1)
Chloride: 109 mmol/L — ABNORMAL HIGH (ref 98–107)
Creatinine: 0.6 mg/dL (ref 0.60–1.30)
EGFR (African American): >60
Glucose: 110 mg/dL — ABNORMAL HIGH (ref 65–99)
Osmolality: 282 (ref 275–301)
Potassium: 4.3 mmol/L (ref 3.5–5.1)
Sodium: 142 mmol/L (ref 136–145)
Total Protein: 6.2 g/dL — ABNORMAL LOW (ref 6.4–8.2)

## 2012-06-18 LAB — CBC WITH DIFFERENTIAL/PLATELET
Basophil #: 0.1 10*3/uL (ref 0.0–0.1)
Eosinophil #: 0.2 10*3/uL (ref 0.0–0.7)
Lymphocyte %: 20.3 %
MCHC: 32.4 g/dL (ref 32.0–36.0)
Monocyte #: 0.5 x10 3/mm (ref 0.2–0.9)
Neutrophil #: 5.4 10*3/uL (ref 1.4–6.5)
Neutrophil %: 70.3 %
RBC: 3.95 10*6/uL (ref 3.80–5.20)
RDW: 21.4 % — ABNORMAL HIGH (ref 11.5–14.5)
WBC: 7.7 10*3/uL (ref 3.6–11.0)

## 2012-06-18 LAB — CK TOTAL AND CKMB (NOT AT ARMC): CK-MB: 1.8 ng/mL (ref 0.5–3.6)

## 2012-06-18 LAB — TROPONIN I: Troponin-I: <0.02 ng/mL

## 2012-07-24 ENCOUNTER — Ambulatory Visit: Payer: Self-pay | Admitting: Neurology

## 2012-09-09 ENCOUNTER — Ambulatory Visit: Payer: Self-pay | Admitting: Pain Medicine

## 2012-09-16 ENCOUNTER — Ambulatory Visit: Payer: Self-pay | Admitting: Family Medicine

## 2013-03-18 ENCOUNTER — Emergency Department: Payer: Self-pay | Admitting: Internal Medicine

## 2013-03-18 LAB — BASIC METABOLIC PANEL
Anion Gap: 6 — ABNORMAL LOW (ref 7–16)
Chloride: 102 mmol/L (ref 98–107)
Co2: 29 mmol/L (ref 21–32)
Creatinine: 0.64 mg/dL (ref 0.60–1.30)
EGFR (African American): >60
Glucose: 86 mg/dL (ref 65–99)
Potassium: 4 mmol/L (ref 3.5–5.1)
Sodium: 137 mmol/L (ref 136–145)

## 2013-03-18 LAB — CBC
MCHC: 33.3 g/dL (ref 32.0–36.0)
MCV: 97 fL (ref 80–100)
Platelet: 380 10*3/uL (ref 150–440)

## 2013-04-09 LAB — CBC
HCT: 38.8 % (ref 35.0–47.0)
HGB: 13.1 g/dL (ref 12.0–16.0)
MCHC: 33.7 g/dL (ref 32.0–36.0)
RBC: 4.02 10*6/uL (ref 3.80–5.20)
RDW: 14.9 % — ABNORMAL HIGH (ref 11.5–14.5)
WBC: 10.6 10*3/uL (ref 3.6–11.0)

## 2013-04-09 LAB — COMPREHENSIVE METABOLIC PANEL
Albumin: 3.2 g/dL — ABNORMAL LOW (ref 3.4–5.0)
Alkaline Phosphatase: 146 U/L — ABNORMAL HIGH (ref 50–136)
BUN: 35 mg/dL — ABNORMAL HIGH (ref 7–18)
Bilirubin,Total: 0.4 mg/dL (ref 0.2–1.0)
Calcium, Total: 8.2 mg/dL — ABNORMAL LOW (ref 8.5–10.1)
Co2: 23 mmol/L (ref 21–32)
Creatinine: 1.06 mg/dL (ref 0.60–1.30)
EGFR (Non-African Amer.): >60
SGOT(AST): 123 U/L — ABNORMAL HIGH (ref 15–37)
SGPT (ALT): 124 U/L — ABNORMAL HIGH (ref 12–78)
Total Protein: 7.9 g/dL (ref 6.4–8.2)

## 2013-04-10 ENCOUNTER — Inpatient Hospital Stay: Payer: Self-pay | Admitting: Internal Medicine

## 2013-04-10 LAB — URINALYSIS, COMPLETE
Bilirubin,UR: NEGATIVE
Glucose,UR: NEGATIVE mg/dL (ref 0–75)
Ketone: NEGATIVE
Leukocyte Esterase: NEGATIVE
Nitrite: NEGATIVE
Protein: NEGATIVE
RBC,UR: 66 /HPF (ref 0–5)
Specific Gravity: 1.011 (ref 1.003–1.030)
Squamous Epithelial: 1

## 2013-04-10 LAB — DRUG SCREEN, URINE
Amphetamines, Ur Screen: NEGATIVE (ref ?–1000)
Cocaine Metabolite,Ur ~~LOC~~: NEGATIVE (ref ?–300)
Methadone, Ur Screen: NEGATIVE (ref ?–300)
Tricyclic, Ur Screen: NEGATIVE (ref ?–1000)

## 2013-04-10 LAB — RAPID INFLUENZA A&B ANTIGENS

## 2013-04-13 LAB — BETA STREP CULTURE(ARMC)

## 2013-05-19 ENCOUNTER — Ambulatory Visit: Payer: Self-pay | Admitting: Family Medicine

## 2013-05-28 ENCOUNTER — Ambulatory Visit: Payer: Self-pay | Admitting: Gastroenterology

## 2013-05-28 LAB — CBC WITH DIFFERENTIAL/PLATELET
Basophil #: 0.1 10*3/uL (ref 0.0–0.1)
Basophil %: 1.1 %
Eosinophil #: 0.5 10*3/uL (ref 0.0–0.7)
Eosinophil %: 4.2 %
Lymphocyte #: 2.5 10*3/uL (ref 1.0–3.6)
MCH: 30.2 pg (ref 26.0–34.0)
MCHC: 32.8 g/dL (ref 32.0–36.0)
MCV: 92 fL (ref 80–100)
Monocyte %: 6.4 %
Platelet: 449 10*3/uL — ABNORMAL HIGH (ref 150–440)
RDW: 16.3 % — ABNORMAL HIGH (ref 11.5–14.5)
WBC: 11 10*3/uL (ref 3.6–11.0)

## 2013-05-28 LAB — IRON: Iron: 130 ug/dL (ref 50–170)

## 2013-05-28 LAB — HEPATIC FUNCTION PANEL A (ARMC)
Alkaline Phosphatase: 124 U/L — ABNORMAL HIGH
Bilirubin, Direct: 0.1 mg/dL (ref 0.00–0.20)
SGOT(AST): 68 U/L — ABNORMAL HIGH (ref 15–37)
Total Protein: 8.6 g/dL — ABNORMAL HIGH (ref 6.4–8.2)

## 2013-05-28 LAB — PROTIME-INR: INR: 0.9

## 2013-05-30 ENCOUNTER — Ambulatory Visit: Payer: Self-pay | Admitting: Gastroenterology

## 2013-07-26 ENCOUNTER — Emergency Department: Payer: Self-pay | Admitting: Emergency Medicine

## 2013-07-26 LAB — BASIC METABOLIC PANEL
ANION GAP: 3 — AB (ref 7–16)
BUN: 10 mg/dL (ref 7–18)
CO2: 27 mmol/L (ref 21–32)
CREATININE: 0.67 mg/dL (ref 0.60–1.30)
Calcium, Total: 8.4 mg/dL — ABNORMAL LOW (ref 8.5–10.1)
Chloride: 104 mmol/L (ref 98–107)
EGFR (African American): >60
GLUCOSE: 248 mg/dL — AB (ref 65–99)
OSMOLALITY: 276 (ref 275–301)
Potassium: 4.2 mmol/L (ref 3.5–5.1)
Sodium: 134 mmol/L — ABNORMAL LOW (ref 136–145)

## 2013-07-26 LAB — CBC
HCT: 42.6 % (ref 35.0–47.0)
HGB: 14 g/dL (ref 12.0–16.0)
MCH: 29.9 pg (ref 26.0–34.0)
MCHC: 32.8 g/dL (ref 32.0–36.0)
MCV: 91 fL (ref 80–100)
PLATELETS: 322 10*3/uL (ref 150–440)
RBC: 4.67 10*6/uL (ref 3.80–5.20)
RDW: 17.9 % — AB (ref 11.5–14.5)
WBC: 11.3 10*3/uL — AB (ref 3.6–11.0)

## 2013-07-26 LAB — TROPONIN I

## 2013-08-12 ENCOUNTER — Ambulatory Visit: Payer: Self-pay | Admitting: Family Medicine

## 2013-10-14 ENCOUNTER — Observation Stay: Payer: Self-pay | Admitting: Specialist

## 2013-10-14 LAB — URINALYSIS, COMPLETE
Bilirubin,UR: NEGATIVE
Blood: NEGATIVE
GLUCOSE, UR: NEGATIVE mg/dL (ref 0–75)
Hyaline Cast: 9
Leukocyte Esterase: NEGATIVE
Nitrite: NEGATIVE
PH: 5 (ref 4.5–8.0)
RBC,UR: 3 /HPF (ref 0–5)
SPECIFIC GRAVITY: 1.028 (ref 1.003–1.030)

## 2013-10-14 LAB — COMPREHENSIVE METABOLIC PANEL
ALBUMIN: 3.3 g/dL — AB (ref 3.4–5.0)
ALK PHOS: 124 U/L — AB
ALT: 26 U/L (ref 12–78)
Anion Gap: 6 — ABNORMAL LOW (ref 7–16)
BUN: 12 mg/dL (ref 7–18)
Bilirubin,Total: 0.2 mg/dL (ref 0.2–1.0)
CHLORIDE: 105 mmol/L (ref 98–107)
CO2: 26 mmol/L (ref 21–32)
Calcium, Total: 8.8 mg/dL (ref 8.5–10.1)
Creatinine: 0.8 mg/dL (ref 0.60–1.30)
EGFR (African American): >60
EGFR (Non-African Amer.): >60
Glucose: 129 mg/dL — ABNORMAL HIGH (ref 65–99)
Osmolality: 275 (ref 275–301)
POTASSIUM: 3.3 mmol/L — AB (ref 3.5–5.1)
SGOT(AST): 27 U/L (ref 15–37)
Sodium: 137 mmol/L (ref 136–145)
Total Protein: 8.3 g/dL — ABNORMAL HIGH (ref 6.4–8.2)

## 2013-10-14 LAB — LIPASE, BLOOD: Lipase: 131 U/L (ref 73–393)

## 2013-10-14 LAB — CBC WITH DIFFERENTIAL/PLATELET
BASOS ABS: 0.1 10*3/uL (ref 0.0–0.1)
BASOS PCT: 0.6 %
Eosinophil #: 0.3 10*3/uL (ref 0.0–0.7)
Eosinophil %: 1.7 %
HCT: 48.8 % — ABNORMAL HIGH (ref 35.0–47.0)
HGB: 15.5 g/dL (ref 12.0–16.0)
LYMPHS ABS: 3.9 10*3/uL — AB (ref 1.0–3.6)
LYMPHS PCT: 22.8 %
MCH: 29.2 pg (ref 26.0–34.0)
MCHC: 31.8 g/dL — ABNORMAL LOW (ref 32.0–36.0)
MCV: 92 fL (ref 80–100)
MONO ABS: 0.8 x10 3/mm (ref 0.2–0.9)
MONOS PCT: 4.5 %
NEUTROS PCT: 70.4 %
Neutrophil #: 12.1 10*3/uL — ABNORMAL HIGH (ref 1.4–6.5)
Platelet: 413 10*3/uL (ref 150–440)
RBC: 5.33 10*6/uL — ABNORMAL HIGH (ref 3.80–5.20)
RDW: 15.8 % — AB (ref 11.5–14.5)
WBC: 17.2 10*3/uL — ABNORMAL HIGH (ref 3.6–11.0)

## 2013-10-16 LAB — CBC WITH DIFFERENTIAL/PLATELET
BASOS PCT: 1 %
Basophil #: 0.1 10*3/uL (ref 0.0–0.1)
Eosinophil #: 0.2 10*3/uL (ref 0.0–0.7)
Eosinophil %: 2.3 %
HCT: 39.4 % (ref 35.0–47.0)
HGB: 13 g/dL (ref 12.0–16.0)
LYMPHS ABS: 2.6 10*3/uL (ref 1.0–3.6)
LYMPHS PCT: 31.4 %
MCH: 30.3 pg (ref 26.0–34.0)
MCHC: 33.1 g/dL (ref 32.0–36.0)
MCV: 92 fL (ref 80–100)
Monocyte #: 0.4 x10 3/mm (ref 0.2–0.9)
Monocyte %: 5.4 %
NEUTROS ABS: 4.9 10*3/uL (ref 1.4–6.5)
Neutrophil %: 59.9 %
Platelet: 308 10*3/uL (ref 150–440)
RBC: 4.3 10*6/uL (ref 3.80–5.20)
RDW: 15.1 % — ABNORMAL HIGH (ref 11.5–14.5)
WBC: 8.2 10*3/uL (ref 3.6–11.0)

## 2013-10-16 LAB — BASIC METABOLIC PANEL
ANION GAP: 7 (ref 7–16)
BUN: 5 mg/dL — AB (ref 7–18)
CALCIUM: 8.5 mg/dL (ref 8.5–10.1)
Chloride: 106 mmol/L (ref 98–107)
Co2: 26 mmol/L (ref 21–32)
Creatinine: 0.74 mg/dL (ref 0.60–1.30)
EGFR (African American): >60
EGFR (Non-African Amer.): >60
GLUCOSE: 164 mg/dL — AB (ref 65–99)
Osmolality: 278 (ref 275–301)
Potassium: 3.3 mmol/L — ABNORMAL LOW (ref 3.5–5.1)
SODIUM: 139 mmol/L (ref 136–145)

## 2013-10-19 LAB — CULTURE, BLOOD (SINGLE)

## 2013-10-30 ENCOUNTER — Ambulatory Visit: Payer: Self-pay | Admitting: Gastroenterology

## 2013-10-30 LAB — HEPATIC FUNCTION PANEL A (ARMC)
ALBUMIN: 3.2 g/dL — AB (ref 3.4–5.0)
AST: 16 U/L (ref 15–37)
Alkaline Phosphatase: 114 U/L
Bilirubin, Direct: <0.05 mg/dL (ref 0.00–0.20)
Bilirubin,Total: 0.3 mg/dL (ref 0.2–1.0)
SGPT (ALT): 24 U/L (ref 12–78)
Total Protein: 7.8 g/dL (ref 6.4–8.2)

## 2014-01-02 ENCOUNTER — Inpatient Hospital Stay: Payer: Self-pay | Admitting: Internal Medicine

## 2014-01-02 LAB — URINALYSIS, COMPLETE
BILIRUBIN, UR: NEGATIVE
BLOOD: NEGATIVE
Ketone: NEGATIVE
LEUKOCYTE ESTERASE: NEGATIVE
NITRITE: NEGATIVE
Ph: 5 (ref 4.5–8.0)
Protein: NEGATIVE
Specific Gravity: 1.018 (ref 1.003–1.030)

## 2014-01-02 LAB — COMPREHENSIVE METABOLIC PANEL
ANION GAP: 8 (ref 7–16)
Albumin: 2.5 g/dL — ABNORMAL LOW (ref 3.4–5.0)
Alkaline Phosphatase: 94 U/L
BILIRUBIN TOTAL: 0.4 mg/dL (ref 0.2–1.0)
BUN: 29 mg/dL — ABNORMAL HIGH (ref 7–18)
CALCIUM: 7.6 mg/dL — AB (ref 8.5–10.1)
CREATININE: 3.42 mg/dL — AB (ref 0.60–1.30)
Chloride: 95 mmol/L — ABNORMAL LOW (ref 98–107)
Co2: 27 mmol/L (ref 21–32)
GFR CALC AF AMER: 18 — AB
GFR CALC NON AF AMER: 15 — AB
Glucose: 391 mg/dL — ABNORMAL HIGH (ref 65–99)
OSMOLALITY: 283 (ref 275–301)
Potassium: 4.8 mmol/L (ref 3.5–5.1)
SGOT(AST): 104 U/L — ABNORMAL HIGH (ref 15–37)
SGPT (ALT): 36 U/L (ref 12–78)
SODIUM: 130 mmol/L — AB (ref 136–145)
Total Protein: 6.5 g/dL (ref 6.4–8.2)

## 2014-01-02 LAB — CK TOTAL AND CKMB (NOT AT ARMC)
CK, TOTAL: 4111 U/L — AB
CK-MB: 41.6 ng/mL — ABNORMAL HIGH (ref 0.5–3.6)

## 2014-01-02 LAB — DRUG SCREEN, URINE
AMPHETAMINES, UR SCREEN: NEGATIVE (ref ?–1000)
Barbiturates, Ur Screen: NEGATIVE (ref ?–200)
Benzodiazepine, Ur Scrn: POSITIVE (ref ?–200)
COCAINE METABOLITE, UR ~~LOC~~: NEGATIVE (ref ?–300)
Cannabinoid 50 Ng, Ur ~~LOC~~: POSITIVE (ref ?–50)
MDMA (Ecstasy)Ur Screen: NEGATIVE (ref ?–500)
Methadone, Ur Screen: NEGATIVE (ref ?–300)
Opiate, Ur Screen: POSITIVE (ref ?–300)
PHENCYCLIDINE (PCP) UR S: NEGATIVE (ref ?–25)
Tricyclic, Ur Screen: NEGATIVE (ref ?–1000)

## 2014-01-02 LAB — CBC
HCT: 35.8 % (ref 35.0–47.0)
HGB: 11.7 g/dL — ABNORMAL LOW (ref 12.0–16.0)
MCH: 29.2 pg (ref 26.0–34.0)
MCHC: 32.8 g/dL (ref 32.0–36.0)
MCV: 89 fL (ref 80–100)
Platelet: 299 10*3/uL (ref 150–440)
RBC: 4.02 10*6/uL (ref 3.80–5.20)
RDW: 16.7 % — AB (ref 11.5–14.5)
WBC: 19.6 10*3/uL — ABNORMAL HIGH (ref 3.6–11.0)

## 2014-01-02 LAB — TROPONIN I
Troponin-I: 0.28 ng/mL — ABNORMAL HIGH
Troponin-I: 0.28 ng/mL — ABNORMAL HIGH

## 2014-01-02 LAB — CK: CK, TOTAL: 8958 U/L — AB

## 2014-01-02 LAB — ETHANOL
Ethanol %: <0.003 % (ref 0.000–0.080)
Ethanol: <3 mg/dL

## 2014-01-02 LAB — AMMONIA: AMMONIA, PLASMA: 19 umol/L (ref 11–32)

## 2014-01-03 DIAGNOSIS — I369 Nonrheumatic tricuspid valve disorder, unspecified: Secondary | ICD-10-CM

## 2014-01-03 LAB — CBC WITH DIFFERENTIAL/PLATELET
Basophil #: 0.1 10*3/uL (ref 0.0–0.1)
Basophil %: 0.6 %
Eosinophil #: 0.3 10*3/uL (ref 0.0–0.7)
Eosinophil %: 1.8 %
HCT: 33.8 % — ABNORMAL LOW (ref 35.0–47.0)
HGB: 10.9 g/dL — ABNORMAL LOW (ref 12.0–16.0)
LYMPHS PCT: 18.2 %
Lymphocyte #: 2.6 10*3/uL (ref 1.0–3.6)
MCH: 28.7 pg (ref 26.0–34.0)
MCHC: 32.2 g/dL (ref 32.0–36.0)
MCV: 89 fL (ref 80–100)
Monocyte #: 0.6 x10 3/mm (ref 0.2–0.9)
Monocyte %: 4.5 %
NEUTROS ABS: 10.6 10*3/uL — AB (ref 1.4–6.5)
NEUTROS PCT: 74.9 %
Platelet: 321 10*3/uL (ref 150–440)
RBC: 3.79 10*6/uL — ABNORMAL LOW (ref 3.80–5.20)
RDW: 15.7 % — ABNORMAL HIGH (ref 11.5–14.5)
WBC: 14.1 10*3/uL — AB (ref 3.6–11.0)

## 2014-01-03 LAB — COMPREHENSIVE METABOLIC PANEL
ALBUMIN: 2.4 g/dL — AB (ref 3.4–5.0)
ALK PHOS: 85 U/L
ALT: 67 U/L (ref 12–78)
Anion Gap: 6 — ABNORMAL LOW (ref 7–16)
BUN: 20 mg/dL — ABNORMAL HIGH (ref 7–18)
Bilirubin,Total: 0.3 mg/dL (ref 0.2–1.0)
CO2: 27 mmol/L (ref 21–32)
Calcium, Total: 7.4 mg/dL — ABNORMAL LOW (ref 8.5–10.1)
Chloride: 107 mmol/L (ref 98–107)
Creatinine: 1.46 mg/dL — ABNORMAL HIGH (ref 0.60–1.30)
EGFR (African American): 50 — ABNORMAL LOW
GFR CALC NON AF AMER: 43 — AB
Glucose: 165 mg/dL — ABNORMAL HIGH (ref 65–99)
Osmolality: 286 (ref 275–301)
Potassium: 3.7 mmol/L (ref 3.5–5.1)
SGOT(AST): 282 U/L — ABNORMAL HIGH (ref 15–37)
Sodium: 140 mmol/L (ref 136–145)
TOTAL PROTEIN: 6.3 g/dL — AB (ref 6.4–8.2)

## 2014-01-03 LAB — CK: CK, TOTAL: 9916 U/L — AB

## 2014-01-03 LAB — PREGNANCY, URINE: Pregnancy Test, Urine: NEGATIVE m[IU]/mL

## 2014-01-03 LAB — TROPONIN I: TROPONIN-I: 0.29 ng/mL — AB

## 2014-01-04 LAB — BASIC METABOLIC PANEL
ANION GAP: 7 (ref 7–16)
BUN: 7 mg/dL (ref 7–18)
CO2: 32 mmol/L (ref 21–32)
Calcium, Total: 7.7 mg/dL — ABNORMAL LOW (ref 8.5–10.1)
Chloride: 102 mmol/L (ref 98–107)
Creatinine: 0.78 mg/dL (ref 0.60–1.30)
EGFR (African American): >60
EGFR (Non-African Amer.): >60
Glucose: 187 mg/dL — ABNORMAL HIGH (ref 65–99)
OSMOLALITY: 284 (ref 275–301)
POTASSIUM: 3.3 mmol/L — AB (ref 3.5–5.1)
Sodium: 141 mmol/L (ref 136–145)

## 2014-01-04 LAB — CK: CK, Total: 6757 U/L — ABNORMAL HIGH

## 2014-01-04 LAB — URINE CULTURE

## 2014-01-07 LAB — CULTURE, BLOOD (SINGLE)

## 2014-01-09 ENCOUNTER — Ambulatory Visit: Payer: Self-pay | Admitting: Family Medicine

## 2014-01-12 ENCOUNTER — Emergency Department: Payer: Self-pay | Admitting: Emergency Medicine

## 2014-01-12 LAB — CBC WITH DIFFERENTIAL/PLATELET
BASOS ABS: 0.1 10*3/uL (ref 0.0–0.1)
Basophil %: 0.9 %
EOS ABS: 0.6 10*3/uL (ref 0.0–0.7)
EOS PCT: 5.4 %
HCT: 37.6 % (ref 35.0–47.0)
HGB: 11.9 g/dL — ABNORMAL LOW (ref 12.0–16.0)
Lymphocyte #: 3.9 10*3/uL — ABNORMAL HIGH (ref 1.0–3.6)
Lymphocyte %: 33.3 %
MCH: 28.6 pg (ref 26.0–34.0)
MCHC: 31.7 g/dL — AB (ref 32.0–36.0)
MCV: 90 fL (ref 80–100)
MONO ABS: 0.5 x10 3/mm (ref 0.2–0.9)
MONOS PCT: 4.6 %
NEUTROS ABS: 6.5 10*3/uL (ref 1.4–6.5)
Neutrophil %: 55.8 %
Platelet: 316 10*3/uL (ref 150–440)
RBC: 4.16 10*6/uL (ref 3.80–5.20)
RDW: 16.7 % — ABNORMAL HIGH (ref 11.5–14.5)
WBC: 11.6 10*3/uL — ABNORMAL HIGH (ref 3.6–11.0)

## 2014-01-12 LAB — URINALYSIS, COMPLETE
BILIRUBIN, UR: NEGATIVE
BLOOD: NEGATIVE
Bacteria: NONE SEEN
Glucose,UR: 50 mg/dL (ref 0–75)
KETONE: NEGATIVE
LEUKOCYTE ESTERASE: NEGATIVE
Nitrite: NEGATIVE
Ph: 5 (ref 4.5–8.0)
Protein: NEGATIVE
Specific Gravity: 1.011 (ref 1.003–1.030)
Squamous Epithelial: 1
WBC UR: <1 /HPF (ref 0–5)

## 2014-01-12 LAB — COMPREHENSIVE METABOLIC PANEL
ALK PHOS: 102 U/L
Albumin: 2.8 g/dL — ABNORMAL LOW (ref 3.4–5.0)
Anion Gap: 6 — ABNORMAL LOW (ref 7–16)
BUN: 11 mg/dL (ref 7–18)
Bilirubin,Total: 0.3 mg/dL (ref 0.2–1.0)
Calcium, Total: 8.7 mg/dL (ref 8.5–10.1)
Chloride: 103 mmol/L (ref 98–107)
Co2: 26 mmol/L (ref 21–32)
Creatinine: 0.75 mg/dL (ref 0.60–1.30)
EGFR (Non-African Amer.): >60
GLUCOSE: 202 mg/dL — AB (ref 65–99)
OSMOLALITY: 275 (ref 275–301)
Potassium: 4 mmol/L (ref 3.5–5.1)
SGOT(AST): 51 U/L — ABNORMAL HIGH (ref 15–37)
SGPT (ALT): 767 U/L — ABNORMAL HIGH (ref 12–78)
Sodium: 135 mmol/L — ABNORMAL LOW (ref 136–145)
Total Protein: 7.4 g/dL (ref 6.4–8.2)

## 2014-01-12 LAB — LIPASE, BLOOD: Lipase: 101 U/L (ref 73–393)

## 2014-03-13 IMAGING — US ABDOMEN ULTRASOUND LIMITED
1 series · 14 of 25 positions shown · non-contrast
Comparison: 

REASON FOR EXAM: elevated LFTs, hypoglycemia, fatigue
COMMENTS:   Body Site: GB and Fossa, CBD, Head of Pancreas

PROCEDURE:     US  - US ABDOMEN LIMITED SURVEY  - April 10, 2013 [DATE]
RESULT:     Right or quadrant abdominal ultrasound dated
TECHNIQUE: Real time sonographic imaging of the right quadrant was obtained.
Static representative images were provided for interpretation.

[Series 1: abdomen ultrasound limited · 0.27mm/px · 14 of 44 slices shown]
[im 1/44]
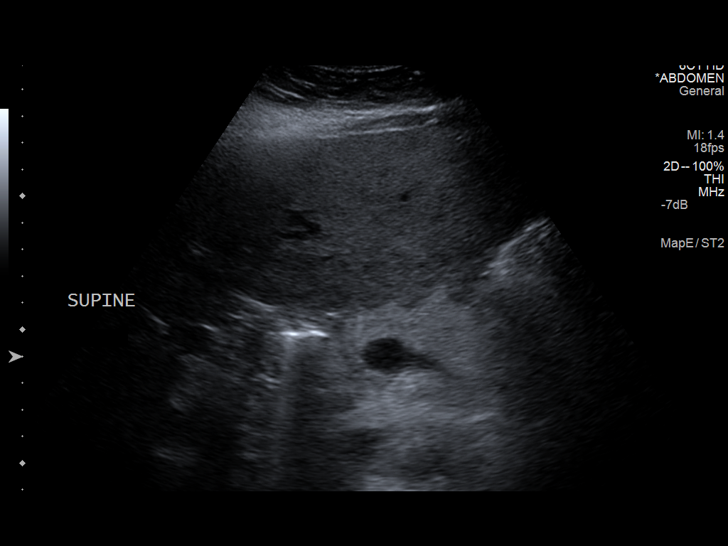
[im 4/44]
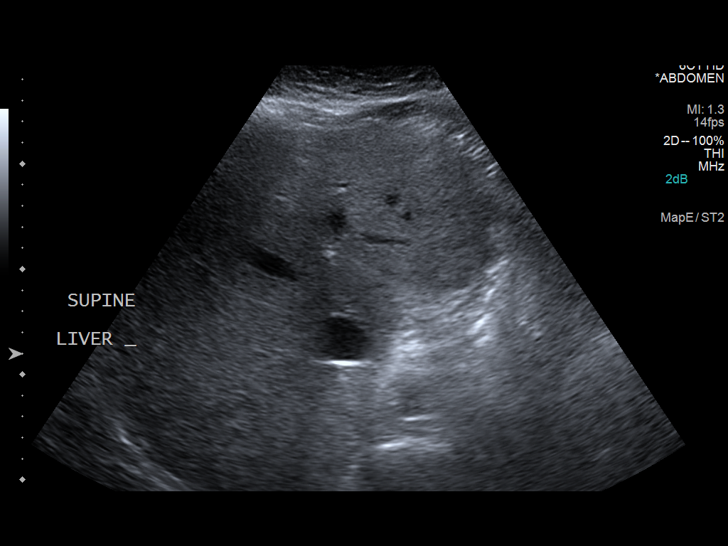
[im 8/44]
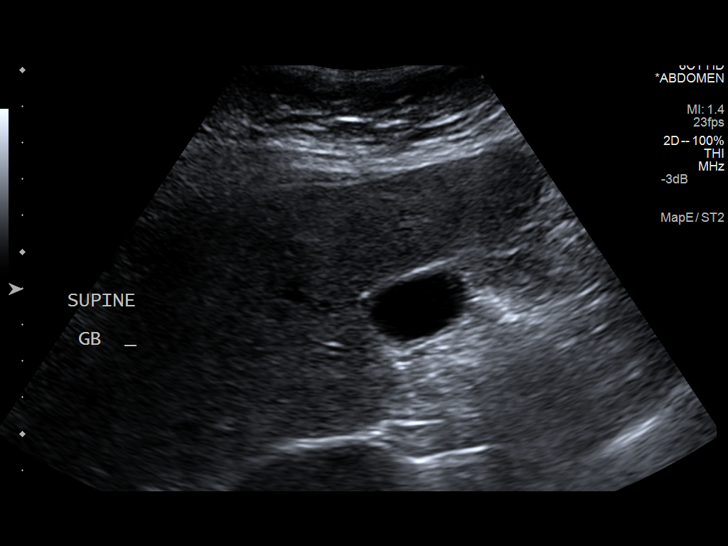
[im 11/44]
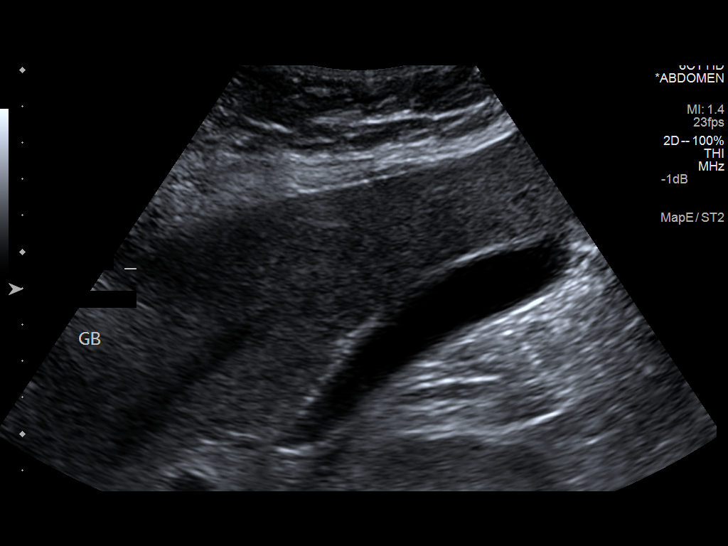
[im 15/44]
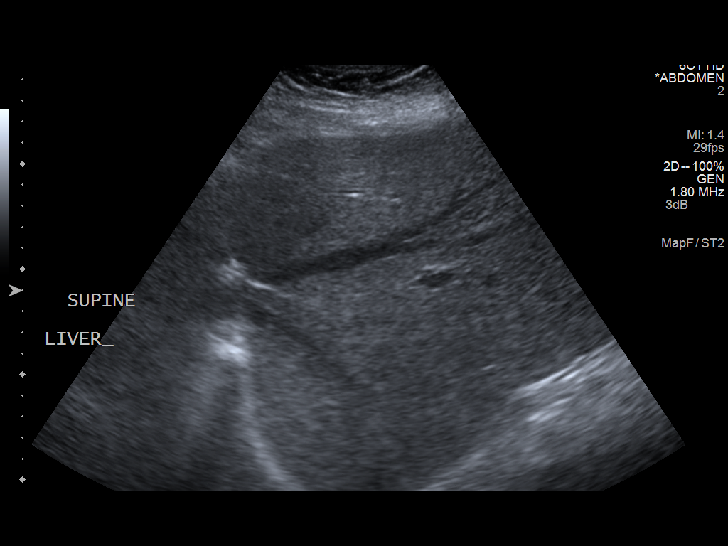
[im 17/44]
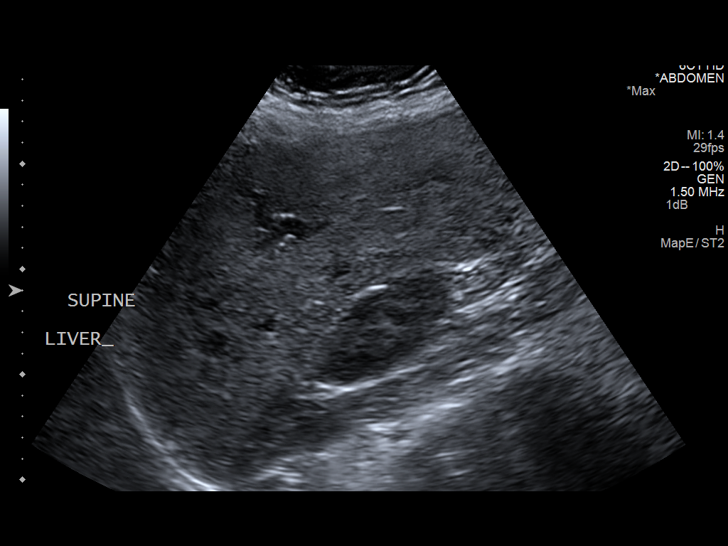
[im 20/44]
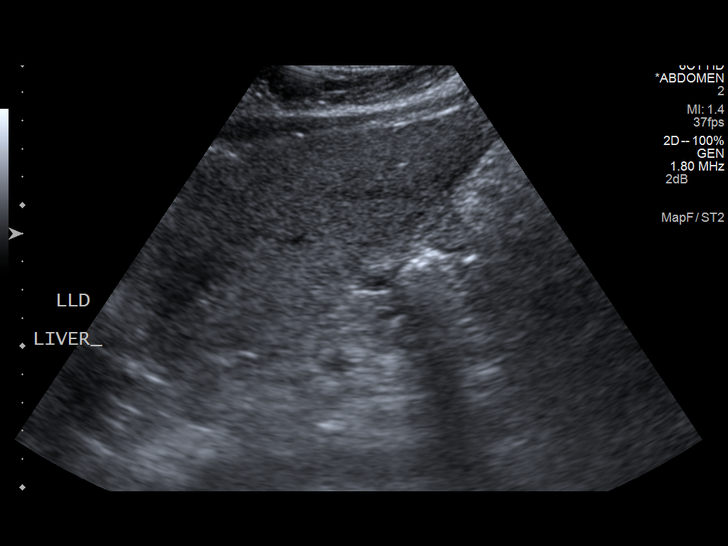
[im 24/44]
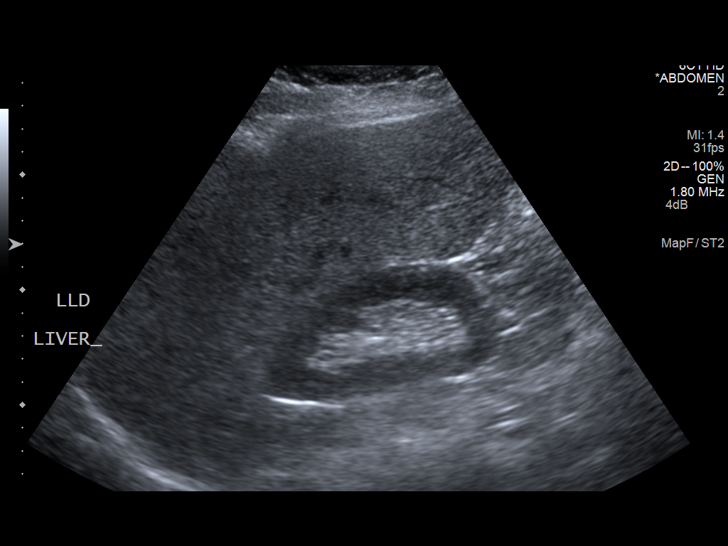
[im 27/44]
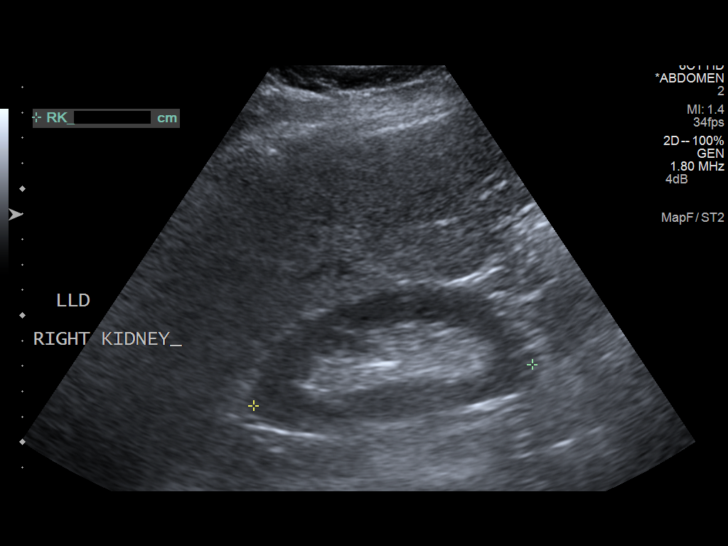
[im 29/44]
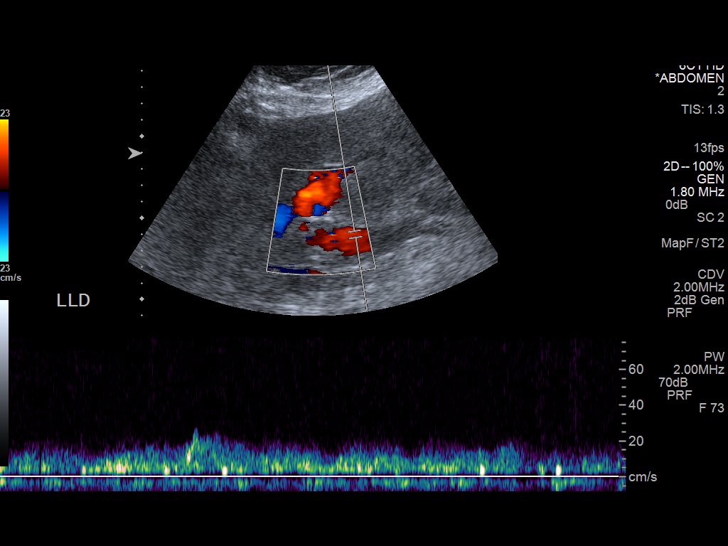
[im 33/44]
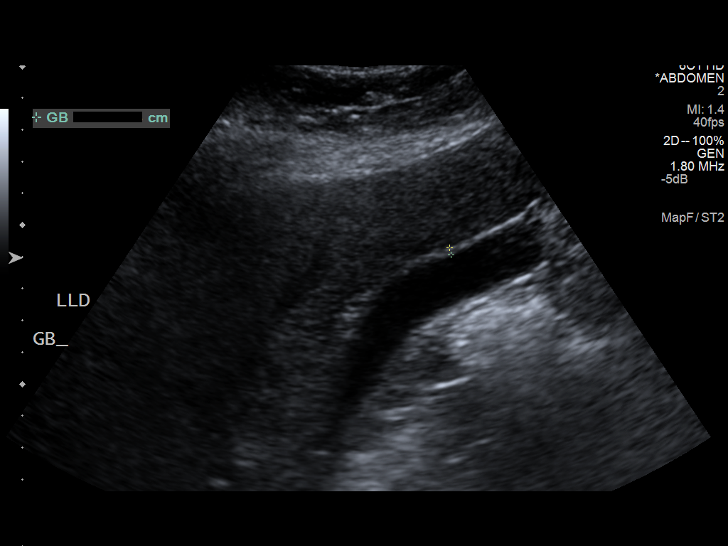
[im 36/44]
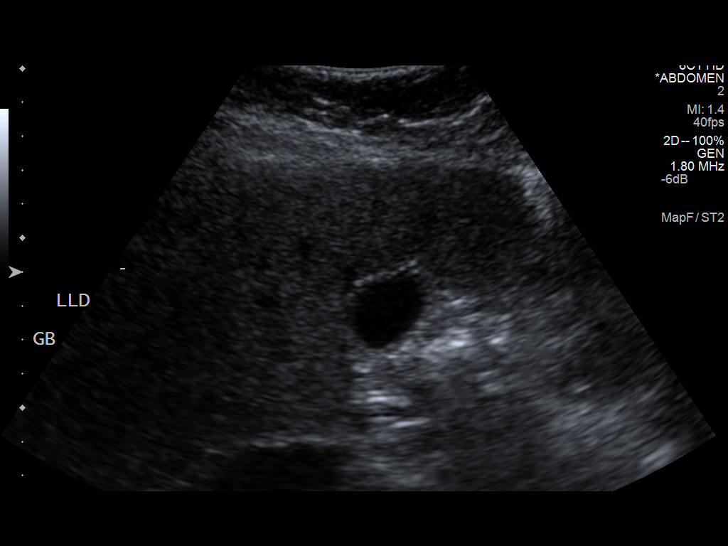
[im 40/44]
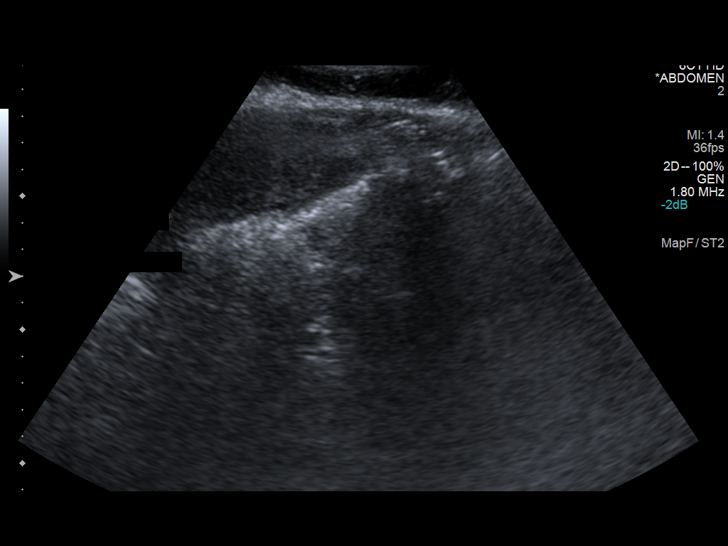
[im 44/44]
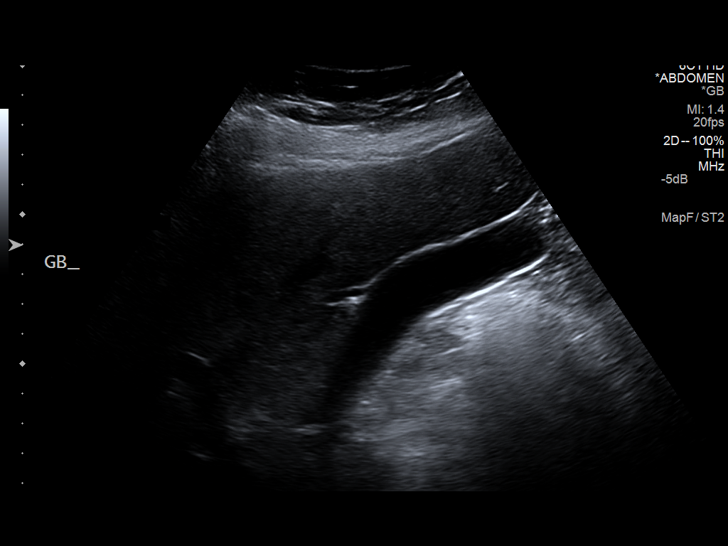

[14 of 25 positions shown; findings below may reference images not displayed]

FINDINGS: The liver demonstrates a colon architecture. Hepatopedal flow is
identified within the portal vein.

There is no evidence of pericholecystic fluid, gallstones, sludging,
gallbladder wall thickening, nor common bile duct dilation. The gallbladder
wall thickness is 2.3 mm and the common bile duct measures 5 mm in diameter.
The technologist was unable to elicit a sonographic Murphy's sign. The
pancreas is unremarkable.
IMPRESSION: No sonographic evidence of cholecystitis nor cholelithiasis.
2. Hepatic steatosis
3. Dr. Tor Stian of the emergency department was informed of these findings per
a preliminary faxed report.

## 2014-03-28 ENCOUNTER — Emergency Department: Payer: Self-pay | Admitting: Student

## 2014-03-28 LAB — CBC
HCT: 41.1 % (ref 35.0–47.0)
HGB: 12.9 g/dL (ref 12.0–16.0)
MCH: 28.1 pg (ref 26.0–34.0)
MCHC: 31.4 g/dL — ABNORMAL LOW (ref 32.0–36.0)
MCV: 90 fL (ref 80–100)
Platelet: 426 10*3/uL (ref 150–440)
RBC: 4.59 10*6/uL (ref 3.80–5.20)
RDW: 15.9 % — AB (ref 11.5–14.5)
WBC: 15.9 10*3/uL — ABNORMAL HIGH (ref 3.6–11.0)

## 2014-03-28 LAB — BASIC METABOLIC PANEL
ANION GAP: 5 — AB (ref 7–16)
BUN: 14 mg/dL (ref 7–18)
CALCIUM: 8.8 mg/dL (ref 8.5–10.1)
Chloride: 104 mmol/L (ref 98–107)
Co2: 27 mmol/L (ref 21–32)
Creatinine: 0.79 mg/dL (ref 0.60–1.30)
EGFR (Non-African Amer.): >60
Glucose: 149 mg/dL — ABNORMAL HIGH (ref 65–99)
Osmolality: 275 (ref 275–301)
Potassium: 3.9 mmol/L (ref 3.5–5.1)
Sodium: 136 mmol/L (ref 136–145)

## 2014-03-28 LAB — TROPONIN I: Troponin-I: <0.02 ng/mL

## 2014-06-03 ENCOUNTER — Emergency Department: Payer: Self-pay | Admitting: Emergency Medicine

## 2014-07-12 DIAGNOSIS — J449 Chronic obstructive pulmonary disease, unspecified: Secondary | ICD-10-CM | POA: Diagnosis not present

## 2014-07-21 DIAGNOSIS — G4733 Obstructive sleep apnea (adult) (pediatric): Secondary | ICD-10-CM | POA: Diagnosis not present

## 2014-07-21 DIAGNOSIS — G4737 Central sleep apnea in conditions classified elsewhere: Secondary | ICD-10-CM | POA: Diagnosis not present

## 2014-08-12 DIAGNOSIS — J449 Chronic obstructive pulmonary disease, unspecified: Secondary | ICD-10-CM | POA: Diagnosis not present

## 2014-08-19 ENCOUNTER — Emergency Department: Payer: Self-pay | Admitting: Emergency Medicine

## 2014-08-19 DIAGNOSIS — R079 Chest pain, unspecified: Secondary | ICD-10-CM | POA: Diagnosis not present

## 2014-08-19 DIAGNOSIS — R0602 Shortness of breath: Secondary | ICD-10-CM | POA: Diagnosis not present

## 2014-08-19 DIAGNOSIS — R05 Cough: Secondary | ICD-10-CM | POA: Diagnosis not present

## 2014-08-19 DIAGNOSIS — R0981 Nasal congestion: Secondary | ICD-10-CM | POA: Diagnosis not present

## 2014-08-20 DIAGNOSIS — G4737 Central sleep apnea in conditions classified elsewhere: Secondary | ICD-10-CM | POA: Diagnosis not present

## 2014-08-20 DIAGNOSIS — J849 Interstitial pulmonary disease, unspecified: Secondary | ICD-10-CM | POA: Diagnosis not present

## 2014-08-20 DIAGNOSIS — J321 Chronic frontal sinusitis: Secondary | ICD-10-CM | POA: Diagnosis not present

## 2014-08-20 DIAGNOSIS — E669 Obesity, unspecified: Secondary | ICD-10-CM | POA: Diagnosis not present

## 2014-08-20 DIAGNOSIS — G4733 Obstructive sleep apnea (adult) (pediatric): Secondary | ICD-10-CM | POA: Diagnosis not present

## 2014-08-20 DIAGNOSIS — E119 Type 2 diabetes mellitus without complications: Secondary | ICD-10-CM | POA: Diagnosis not present

## 2014-08-20 DIAGNOSIS — J441 Chronic obstructive pulmonary disease with (acute) exacerbation: Secondary | ICD-10-CM | POA: Diagnosis not present

## 2014-08-27 DIAGNOSIS — R531 Weakness: Secondary | ICD-10-CM | POA: Diagnosis not present

## 2014-08-27 DIAGNOSIS — R109 Unspecified abdominal pain: Secondary | ICD-10-CM | POA: Diagnosis not present

## 2014-08-27 DIAGNOSIS — R7309 Other abnormal glucose: Secondary | ICD-10-CM | POA: Diagnosis not present

## 2014-08-28 ENCOUNTER — Emergency Department: Payer: Self-pay | Admitting: Emergency Medicine

## 2014-08-28 DIAGNOSIS — Z79899 Other long term (current) drug therapy: Secondary | ICD-10-CM | POA: Diagnosis not present

## 2014-08-28 DIAGNOSIS — I10 Essential (primary) hypertension: Secondary | ICD-10-CM | POA: Diagnosis not present

## 2014-08-28 DIAGNOSIS — F329 Major depressive disorder, single episode, unspecified: Secondary | ICD-10-CM | POA: Diagnosis not present

## 2014-08-28 DIAGNOSIS — Z793 Long term (current) use of hormonal contraceptives: Secondary | ICD-10-CM | POA: Diagnosis not present

## 2014-08-28 DIAGNOSIS — K5900 Constipation, unspecified: Secondary | ICD-10-CM | POA: Diagnosis not present

## 2014-08-28 DIAGNOSIS — Z7951 Long term (current) use of inhaled steroids: Secondary | ICD-10-CM | POA: Diagnosis not present

## 2014-08-28 DIAGNOSIS — R109 Unspecified abdominal pain: Secondary | ICD-10-CM | POA: Diagnosis not present

## 2014-08-28 DIAGNOSIS — F419 Anxiety disorder, unspecified: Secondary | ICD-10-CM | POA: Diagnosis not present

## 2014-08-28 DIAGNOSIS — E119 Type 2 diabetes mellitus without complications: Secondary | ICD-10-CM | POA: Diagnosis not present

## 2014-08-28 DIAGNOSIS — K429 Umbilical hernia without obstruction or gangrene: Secondary | ICD-10-CM | POA: Diagnosis not present

## 2014-08-28 DIAGNOSIS — Z720 Tobacco use: Secondary | ICD-10-CM | POA: Diagnosis not present

## 2014-08-28 DIAGNOSIS — R05 Cough: Secondary | ICD-10-CM | POA: Diagnosis not present

## 2014-08-31 ENCOUNTER — Ambulatory Visit: Payer: Self-pay

## 2014-08-31 DIAGNOSIS — K429 Umbilical hernia without obstruction or gangrene: Secondary | ICD-10-CM | POA: Diagnosis not present

## 2014-08-31 DIAGNOSIS — M25512 Pain in left shoulder: Secondary | ICD-10-CM | POA: Diagnosis not present

## 2014-08-31 DIAGNOSIS — S4992XA Unspecified injury of left shoulder and upper arm, initial encounter: Secondary | ICD-10-CM | POA: Diagnosis not present

## 2014-08-31 DIAGNOSIS — N3000 Acute cystitis without hematuria: Secondary | ICD-10-CM | POA: Diagnosis not present

## 2014-08-31 DIAGNOSIS — J849 Interstitial pulmonary disease, unspecified: Secondary | ICD-10-CM | POA: Diagnosis not present

## 2014-08-31 DIAGNOSIS — N182 Chronic kidney disease, stage 2 (mild): Secondary | ICD-10-CM | POA: Diagnosis not present

## 2014-09-10 DIAGNOSIS — J449 Chronic obstructive pulmonary disease, unspecified: Secondary | ICD-10-CM | POA: Diagnosis not present

## 2014-09-11 DIAGNOSIS — E119 Type 2 diabetes mellitus without complications: Secondary | ICD-10-CM | POA: Diagnosis not present

## 2014-09-14 DIAGNOSIS — G4737 Central sleep apnea in conditions classified elsewhere: Secondary | ICD-10-CM | POA: Diagnosis not present

## 2014-09-14 DIAGNOSIS — G4733 Obstructive sleep apnea (adult) (pediatric): Secondary | ICD-10-CM | POA: Diagnosis not present

## 2014-09-21 DIAGNOSIS — G4733 Obstructive sleep apnea (adult) (pediatric): Secondary | ICD-10-CM | POA: Diagnosis not present

## 2014-09-21 DIAGNOSIS — G4737 Central sleep apnea in conditions classified elsewhere: Secondary | ICD-10-CM | POA: Diagnosis not present

## 2014-10-02 ENCOUNTER — Inpatient Hospital Stay
Admit: 2014-10-02 | Discharge status: Against Medical Advice | Payer: Self-pay | Attending: Internal Medicine | Admitting: Internal Medicine

## 2014-10-02 DIAGNOSIS — G92 Toxic encephalopathy: Secondary | ICD-10-CM | POA: Diagnosis not present

## 2014-10-02 DIAGNOSIS — Z882 Allergy status to sulfonamides status: Secondary | ICD-10-CM | POA: Diagnosis not present

## 2014-10-02 DIAGNOSIS — J811 Chronic pulmonary edema: Secondary | ICD-10-CM | POA: Diagnosis not present

## 2014-10-02 DIAGNOSIS — N179 Acute kidney failure, unspecified: Secondary | ICD-10-CM | POA: Diagnosis not present

## 2014-10-02 DIAGNOSIS — Z794 Long term (current) use of insulin: Secondary | ICD-10-CM | POA: Diagnosis not present

## 2014-10-02 DIAGNOSIS — Z86711 Personal history of pulmonary embolism: Secondary | ICD-10-CM | POA: Diagnosis not present

## 2014-10-02 DIAGNOSIS — Z79899 Other long term (current) drug therapy: Secondary | ICD-10-CM | POA: Diagnosis not present

## 2014-10-02 DIAGNOSIS — F1290 Cannabis use, unspecified, uncomplicated: Secondary | ICD-10-CM | POA: Diagnosis not present

## 2014-10-02 DIAGNOSIS — E1140 Type 2 diabetes mellitus with diabetic neuropathy, unspecified: Secondary | ICD-10-CM | POA: Diagnosis not present

## 2014-10-02 DIAGNOSIS — M797 Fibromyalgia: Secondary | ICD-10-CM | POA: Diagnosis not present

## 2014-10-02 DIAGNOSIS — E860 Dehydration: Secondary | ICD-10-CM | POA: Diagnosis not present

## 2014-10-02 DIAGNOSIS — I129 Hypertensive chronic kidney disease with stage 1 through stage 4 chronic kidney disease, or unspecified chronic kidney disease: Secondary | ICD-10-CM | POA: Diagnosis not present

## 2014-10-02 DIAGNOSIS — J45909 Unspecified asthma, uncomplicated: Secondary | ICD-10-CM | POA: Diagnosis not present

## 2014-10-02 DIAGNOSIS — F4000 Agoraphobia, unspecified: Secondary | ICD-10-CM | POA: Diagnosis not present

## 2014-10-02 DIAGNOSIS — F3130 Bipolar disorder, current episode depressed, mild or moderate severity, unspecified: Secondary | ICD-10-CM | POA: Diagnosis not present

## 2014-10-02 DIAGNOSIS — T50901A Poisoning by unspecified drugs, medicaments and biological substances, accidental (unintentional), initial encounter: Secondary | ICD-10-CM | POA: Diagnosis not present

## 2014-10-02 DIAGNOSIS — M47816 Spondylosis without myelopathy or radiculopathy, lumbar region: Secondary | ICD-10-CM | POA: Diagnosis not present

## 2014-10-02 DIAGNOSIS — G4730 Sleep apnea, unspecified: Secondary | ICD-10-CM | POA: Diagnosis not present

## 2014-10-02 DIAGNOSIS — E1165 Type 2 diabetes mellitus with hyperglycemia: Secondary | ICD-10-CM | POA: Diagnosis not present

## 2014-10-02 DIAGNOSIS — R4182 Altered mental status, unspecified: Secondary | ICD-10-CM | POA: Diagnosis not present

## 2014-10-02 DIAGNOSIS — D72829 Elevated white blood cell count, unspecified: Secondary | ICD-10-CM | POA: Diagnosis not present

## 2014-10-02 DIAGNOSIS — M545 Low back pain: Secondary | ICD-10-CM | POA: Diagnosis not present

## 2014-10-02 DIAGNOSIS — G9340 Encephalopathy, unspecified: Secondary | ICD-10-CM | POA: Diagnosis not present

## 2014-10-02 DIAGNOSIS — Z915 Personal history of self-harm: Secondary | ICD-10-CM | POA: Diagnosis not present

## 2014-10-02 DIAGNOSIS — G40909 Epilepsy, unspecified, not intractable, without status epilepticus: Secondary | ICD-10-CM | POA: Diagnosis not present

## 2014-10-02 DIAGNOSIS — T50904A Poisoning by unspecified drugs, medicaments and biological substances, undetermined, initial encounter: Secondary | ICD-10-CM | POA: Diagnosis not present

## 2014-10-02 DIAGNOSIS — N182 Chronic kidney disease, stage 2 (mild): Secondary | ICD-10-CM | POA: Diagnosis not present

## 2014-10-02 DIAGNOSIS — Z87891 Personal history of nicotine dependence: Secondary | ICD-10-CM | POA: Diagnosis not present

## 2014-10-02 LAB — COMPREHENSIVE METABOLIC PANEL
ALK PHOS: 90 U/L
ANION GAP: 9 (ref 7–16)
Albumin: 3.2 g/dL — ABNORMAL LOW
BILIRUBIN TOTAL: 0.2 mg/dL — AB
BUN: 39 mg/dL — ABNORMAL HIGH
CHLORIDE: 103 mmol/L
CREATININE: 2.14 mg/dL — AB
Calcium, Total: 8.9 mg/dL
Co2: 23 mmol/L
GFR CALC AF AMER: 31 — AB
GFR CALC NON AF AMER: 27 — AB
GLUCOSE: 219 mg/dL — AB
POTASSIUM: 3.9 mmol/L
SGOT(AST): 22 U/L
SGPT (ALT): 24 U/L
Sodium: 135 mmol/L
TOTAL PROTEIN: 7.5 g/dL

## 2014-10-02 LAB — URINALYSIS, COMPLETE
BACTERIA: NONE SEEN
BILIRUBIN, UR: NEGATIVE
Blood: NEGATIVE
Ketone: NEGATIVE
Leukocyte Esterase: NEGATIVE
Nitrite: NEGATIVE
Ph: 5 (ref 4.5–8.0)
Protein: NEGATIVE
RBC,UR: <1 /HPF (ref 0–5)
SPECIFIC GRAVITY: 1.013 (ref 1.003–1.030)
WBC UR: <1 /HPF (ref 0–5)

## 2014-10-02 LAB — DRUG SCREEN, URINE
Amphetamines, Ur Screen: NEGATIVE
Barbiturates, Ur Screen: NEGATIVE
Benzodiazepine, Ur Scrn: POSITIVE
COCAINE METABOLITE, UR ~~LOC~~: NEGATIVE
Cannabinoid 50 Ng, Ur ~~LOC~~: POSITIVE
MDMA (Ecstasy)Ur Screen: NEGATIVE
Methadone, Ur Screen: NEGATIVE
OPIATE, UR SCREEN: POSITIVE
Phencyclidine (PCP) Ur S: NEGATIVE
Tricyclic, Ur Screen: NEGATIVE

## 2014-10-02 LAB — CBC
HCT: 35.4 % (ref 35.0–47.0)
HGB: 11 g/dL — AB (ref 12.0–16.0)
MCH: 25 pg — ABNORMAL LOW (ref 26.0–34.0)
MCHC: 31.2 g/dL — ABNORMAL LOW (ref 32.0–36.0)
MCV: 80 fL (ref 80–100)
Platelet: 495 10*3/uL — ABNORMAL HIGH (ref 150–440)
RBC: 4.41 10*6/uL (ref 3.80–5.20)
RDW: 18.6 % — ABNORMAL HIGH (ref 11.5–14.5)
WBC: 16.6 10*3/uL — ABNORMAL HIGH (ref 3.6–11.0)

## 2014-10-02 LAB — TROPONIN I: TROPONIN-I: 0.03 ng/mL

## 2014-10-02 LAB — LACTIC ACID, PLASMA: Lactic Acid, Venous: 1.4 mmol/L

## 2014-10-02 LAB — CK: CK, TOTAL: 182 U/L

## 2014-10-03 LAB — CBC WITH DIFFERENTIAL/PLATELET
BASOS ABS: 0.1 10*3/uL (ref 0.0–0.1)
Basophil %: 0.7 %
Eosinophil #: 0 10*3/uL (ref 0.0–0.7)
Eosinophil %: 0.2 %
HCT: 33.3 % — ABNORMAL LOW (ref 35.0–47.0)
HGB: 10.5 g/dL — AB (ref 12.0–16.0)
LYMPHS ABS: 1.7 10*3/uL (ref 1.0–3.6)
LYMPHS PCT: 12.1 %
MCH: 25.1 pg — ABNORMAL LOW (ref 26.0–34.0)
MCHC: 31.6 g/dL — ABNORMAL LOW (ref 32.0–36.0)
MCV: 80 fL (ref 80–100)
MONO ABS: 0.6 x10 3/mm (ref 0.2–0.9)
Monocyte %: 4.2 %
Neutrophil #: 11.3 10*3/uL — ABNORMAL HIGH (ref 1.4–6.5)
Neutrophil %: 82.8 %
PLATELETS: 450 10*3/uL — AB (ref 150–440)
RBC: 4.19 10*6/uL (ref 3.80–5.20)
RDW: 18.9 % — ABNORMAL HIGH (ref 11.5–14.5)
WBC: 13.6 10*3/uL — ABNORMAL HIGH (ref 3.6–11.0)

## 2014-10-03 LAB — BASIC METABOLIC PANEL
Anion Gap: 7 (ref 7–16)
BUN: 28 mg/dL — AB
CREATININE: 1.26 mg/dL — AB
Calcium, Total: 8.8 mg/dL — ABNORMAL LOW
Chloride: 106 mmol/L
Co2: 26 mmol/L
EGFR (African American): 59 — ABNORMAL LOW
EGFR (Non-African Amer.): 51 — ABNORMAL LOW
Glucose: 174 mg/dL — ABNORMAL HIGH
Potassium: 3.9 mmol/L
Sodium: 139 mmol/L

## 2014-10-03 LAB — HEMOGLOBIN A1C: Hemoglobin A1C: 8.1 % — ABNORMAL HIGH

## 2014-10-03 LAB — TROPONIN I

## 2014-10-11 DIAGNOSIS — J449 Chronic obstructive pulmonary disease, unspecified: Secondary | ICD-10-CM | POA: Diagnosis not present

## 2014-10-13 DIAGNOSIS — G4730 Sleep apnea, unspecified: Secondary | ICD-10-CM | POA: Diagnosis not present

## 2014-10-13 DIAGNOSIS — M797 Fibromyalgia: Secondary | ICD-10-CM | POA: Diagnosis not present

## 2014-10-13 DIAGNOSIS — E119 Type 2 diabetes mellitus without complications: Secondary | ICD-10-CM | POA: Diagnosis not present

## 2014-10-13 DIAGNOSIS — I10 Essential (primary) hypertension: Secondary | ICD-10-CM | POA: Diagnosis not present

## 2014-10-13 DIAGNOSIS — E669 Obesity, unspecified: Secondary | ICD-10-CM | POA: Diagnosis not present

## 2014-10-20 DIAGNOSIS — E119 Type 2 diabetes mellitus without complications: Secondary | ICD-10-CM | POA: Diagnosis not present

## 2014-10-20 DIAGNOSIS — I10 Essential (primary) hypertension: Secondary | ICD-10-CM | POA: Diagnosis not present

## 2014-10-20 NOTE — Discharge Summary (Signed)
PATIENT NAME:  Kelsey Sanchez, Kelsey Sanchez MR#:  [id number] DATE OF BIRTH:  1966-03-24  DATE OF ADMISSION:  06/17/2012 DATE OF DISCHARGE:  06/18/2012  DISCHARGE DIAGNOSES:  1. Narcotic overdose with metabolic encephalopathy, resolved.  2. Hyperkalemia, resolved.  3. Acute bronchitis.  4. Diabetes mellitus type 2. 5. Hypertension.  6. Hyperlipidemia.  7. Chronic fibromyalgia. 8. Chronic back pain.  9. History of tobacco abuse and chronic obstructive pulmonary disease.   DISCHARGE MEDICATIONS:  1. Keppra 500 mg p.o. b.i.d.  2. Pantoprazole 40 mg p.o. daily.  3. Amlodipine 10 mg p.o. daily.  4. Zanaflex 6 mg p.o. every 8 hours.  5. Symbicort 160/4.5 two puffs for 2 times a day. 6. Glipizide 80 mg p.o. daily.  7. Klonopin 1 mg p.o. t.i.d. 8. Lyrica 75 mg 1 capsule p.o. b.i.d.  9. Lantus 40 units at bedtime.  10. Saphris 10 mg sublingual once a day after dinner. 11. Epitol 1 tablet p.o. b.i.d.  12. Glimepiride 2 mg p.o. daily.  13. Actos 45 mg p.o. daily.  14. Actos 45 mg p.o. daily.  15. Percocet 5/325 one to two tablets every 4 to 6 hours as needed.  16. Levaquin 500 mg daily for 5 more days.  17. Oxygen 2 liters by nasal cannula.   DIET: Low sodium, low fat, ADA diet.   FOLLOWUP: Follow up with her psychiatrist at [place], [place].  CONSULTATION: Psychiatry consult, Dr. Franchot Mimes.  HOSPITAL COURSE: A 49 year old female patient with fibromyalgia, chronic pain syndrome admitted for altered mental status. The patient took pain medications, oxycodone, Dilaudid and Zanaflex tablet at the same time and she was brought in by family because of unresponsiveness. The patient given Narcan and admitted to ICU for Narcan drip. The patient was admitted to the hospitalist service for her altered mental status. The patient's lab data on admission showed alcohol level less than 3. Urine toxicology is positive for opiates and cannabinoids. Her other lab data shows ABG with pCO2 of 46, pO2 of 92.  Chest x-ray was clear and CBC was normal. Tylenol level less than 2. Electrolytes were potassium was slightly elevated at 5.7 on admission. She was admitted for Narcan drip and she improved. Her mental status improved the following day and she was off the Narcan drip around 5:00 in the morning. The patient's mental status improved. She told me because of her pain, she had to take Dilaudid on top of her regular pain medication. On admission, she received IVC for possible drug overdose and one-to-one sitter was there. The patient told that usually she does not take Dilaudid, but that day, she had to take because her pain in the back was uncontrollable, and she took 2 tablets of 5 mg of oxycodone and Zanaflex 4 mg tablets 2, and Lyrica and Dilaudid 2 mg 1 tablet. Next thing she remembered was she was unresponsive,brought  to ER byfamily. She denied any suicidal ideation and she said she has been pain issues and was given pain medicine for the last 2 months. She is supposed to see pain management specialist. I advised her not to use pain medications all at one time and also not to use more than prescribed amount. The patient had history of overdose on Lantus this year in January and also history of crack abuse before, but she is following up with psychiatrist, Dr.  in [place], [place]. She was seen by Dr. Franchot Mimes here and she discontinued VC and one-to-one because she contracted for safety and the patient discharged  home in stable condition. The patient advised to follow up with her psychiatrist.  The patient use to have some hyperkalemia that improved. Her potassium came back normal. The patient has some acute bronchitis with cold and cough, so we gave the amoxicillin to continue. She has history of COPD with ongoing tobacco abuse. She has home oxygen and she is advised to continue that. She has history of seizures, will continue Keppra. She has history of PE, but recently she had a CT of her chest in  April that did not show any pulmonary emboli.  PATIENT CONDITION: Stable at the time of discharge.  TIME SPENT ON DISCHARGE PREPARATION: More than 30 minutes.   ____________________________ Epifanio Lesches, MD sk:es D: 06/20/2012 23:37:00 ET T: 06/21/2012 11:24:38 ET JOB#: [id number]  cc: Epifanio Lesches, MD, <Dictator> Epifanio Lesches MD ELECTRONICALLY SIGNED 06/23/2012 13:23

## 2014-10-20 NOTE — Consult Note (Signed)
PATIENT NAME:  Kelsey Sanchez, SEYLLER MR#:  [id number] DATE OF BIRTH:  [birth year]  DATE OF CONSULTATION:  06/18/2012  REFERRING PHYSICIAN:   CONSULTING PHYSICIAN:  Zania Kalisz K. Nira Visscher, MD  SUBJECTIVE:  A 49 year old white female, not employed and on disability since 2008 for bipolar disorder. The patient is married for 30 years and lives with her husband who is 38 years old and 2 of her children that are 13 and 7 years old.  The patient comes for admission and according to information obtained from her:  CHIEF COMPLAINT:  "I don't know why I'm here. I just woke up and I'm here being treated."  HISTORY OF PRESENT ILLNESS:  The patient reports all that she can remember was on 06/17/2012, her 20 year old son came and visited here, complained about his new wife, and her 31 year old son came and talked to her. She had pain for which she took oxycodone, which was prescribed, and this pain did not go away so she took Dilaudid, which she got from one of her friends. She does not know who called EMS, and all she knows is she woke up here being treated.  PAST PSYCHIATRIC HISTORY: History of inpatient hold psychiatry to [facility] about 5 or 6 times in the past. Per patient reports, "My life has changed and I changed it since I quit using crack by myself." Admits that she is being followed by Dr. Kasandra Knudsen at [facility]. Last appointment was in October 2013. Next appointment is in February 2014. She admits that she did overdose on prescription pills on a few occasions in the past.  ALCOHOL AND DRUGS: Denies drinking alcohol. Admits that she has been smoking crack for 5 years and quit by herself in January 2013. No history of IV drugs. Does admit smoking nicotine cigarettes at the rate of 2 packs a day for many years.  MENTAL STATUS EXAMINATION: The patient is seen lying comfortably in bed in CCU, dressed up in hospital scrub clothes. Alert and oriented to place, person and time. Affect is  appropriate with her mood, which is stable. Denies feeling depressed. Denies feeling hopeless. Also denies feeling worthless or useless. Denies suicidal or homicidal plans. No psychosis. Denies auditory or visual hallucinations, hearing voices or seeing things. Denies paranoid or suspicious ideas. Cognition is intact. She could spell the word "world" forward and backward without any problem. She could count money. Contracts for safety and smiled and said that son's birthday is tomorrow, that is, 06/19/2012 when he will be 47 years old and is eager to go home. She wants to keep up her followup appointments with Dr. Kasandra Knudsen.  IMPRESSION:  Bipolar disorder type I, last episode depressed. Nicotine dependence. 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  RECOMMENDATIONS: Discontinue Geophysical data processor, as the patient contracts for safety. Discontinue  One on One observation and sitter. D/C _IVC. Discharge the patient home when medically cleared and stable, and she will keep up her followup appointments with Dr. Kasandra Knudsen. at [facility] in [place]    ____________________________ Wallace Cullens. Franchot Mimes, MD skc:jm D: 06/18/2012 13:34:39 ET T: 06/18/2012 19:42:22 ET JOB#: [id number]  cc: Arlyn Leak K. Franchot Mimes, MD, <Dictator> Dewain Penning MD ELECTRONICALLY SIGNED 06/19/2012 10:22

## 2014-10-21 DIAGNOSIS — G4733 Obstructive sleep apnea (adult) (pediatric): Secondary | ICD-10-CM | POA: Diagnosis not present

## 2014-10-21 DIAGNOSIS — G4737 Central sleep apnea in conditions classified elsewhere: Secondary | ICD-10-CM | POA: Diagnosis not present

## 2014-10-23 NOTE — Discharge Summary (Signed)
PATIENT NAME:  Kelsey Sanchez, Kelsey Sanchez MR#:  [id number] DATE OF BIRTH:  1966/03/19  DATE OF ADMISSION:  04/10/2013 DATE OF DISCHARGE:  04/11/2013  PRIMARY CARE PHYSICIAN:  Dr. Clemmie Krill.   CHIEF COMPLAINT:  Altered mental status.   DISCHARGE DIAGNOSES:  1.  Hypoglycemia, resolved now.  2.  Type 2 diabetes.  3.  Morbid obesity.  4.  Hypertension.   CODE STATUS:  FULL CODE.   MEDICATIONS:  1.  Keppra 500 b.i.d.  2.  Albuterol oral inhaler 2 puffs q.4 p.r.n.  3.  Zanaflex 6 mg 1 capsule 8 hourly as needed.  4.  Symbicort 160/4.5, two puffs b.i.d.  5.  Lipitor 80 mg at bedtime.  6.  Lyrica 75 mg b.i.d.  7.  Epitol 200 mg 1 tablet in the morning and 2 tablets in the evening.  8.  BC 845/65 oral powder for reconstitution, 1 packet every 4 to 6 hours as needed.  9.  Klonopin 1 mg 4 times a day.  10.  Saphris 5 mg sublingual 3 tablets at bedtime.  11.  Amlodipine 5 mg daily.  12.  Hydrochlorothiazide 25 mg daily.  13.  Metformin 850 mg 3 times a day.  14.  Protonix 20 mg p.o. daily b.i.d.  15.  Temazepam 30 mg at bedtime.  16.  Iron 50, 1 tablet daily.  17.  Vitamin C 1000 mg p.o. daily.  18.  Aspirin 81 mg daily.  19.  Promethazine 5 mL every 6 hours as needed.  20.  Lidocaine 5 mL on the mucous membrane along with Phenergan for swish and swallow.  21.  Fexofenadine/pseudoephedrine 60/120, one tablet b.i.d.  22.  Bioglitazone 45 mg daily.  23.  Brillintix 1 tablet daily.   DIET:  Low sodium, carbohydrate-controlled diet.  CONSULTATION:   1.  Dr. Weber Cooks, Psychiatry.  2.  Dr. Gabriel Carina, Endocrinology.   BRIEF SUMMARY OF HOSPITAL COURSE:  Ula Couvillon is a 49 year old Caucasian female with bipolar disorder, depression, a history of suicidal attempts in the past, seizure disorder and diabetes, comes in with altered mental status, was found sugar of 21. She was admitted with hypoglycemia and acute encephalopathy, which resolved after she was given IV dextrose drip and D50. She was seen by Dr.  Gabriel Carina and started her on metformin and pioglitazone.   1.  Acute encephalopathy resolved after sugars were stabilized.  2.  Hepatic steatosis, elevated LFTs. Will follow up as outpatient.  3.  Seizure disorder. Continued on Tegretol, Klonopin and Keppra.  4.  Depression with bipolar disorder. Her home meds were continued.  5.  COPD. The patient remained stable and sats were stable.  6.  Hypertension. Norvasc and hydrochlorothiazide were continued.  7.  Hospital stay otherwise remained stable.   CODE STATUS:  THE PATIENT REMAINED A FULL CODE.   TIME SPENT: 40 minutes.   ____________________________ Hart Rochester Posey Pronto, MD sap:jm D: 04/11/2013 15:19:50 ET T: 04/11/2013 15:52:14 ET JOB#: [id number]  cc: Keishawna Carranza A. Posey Pronto, MD, <Dictator> Valetta Close, MD A. Lavone Orn, MD Gonzella Lex, MD Ilda Basset MD ELECTRONICALLY SIGNED 04/25/2013 15:03

## 2014-10-23 NOTE — H&P (Signed)
PATIENT NAME:  Kelsey Sanchez, Kelsey Sanchez MR#:  [id number] DATE OF BIRTH:  1965/11/06  DATE OF ADMISSION:  06/17/2012  PRIMARY CARE PHYSICIAN: Dr. Lovie Macadamia.  HISTORY OF PRESENT ILLNESS: The patient is a 49 year old Caucasian female with past medical history significant for history of bipolar disorder, history of depression, recurrent history of cocaine dependence, history of overdose on insulin in January 2013. She presented back to the hospital with a family report of the patient taking multiple doses of Dilaudid as well as some benzodiazepines. She presented poorly responsive. She was given Narcan by EMS as well as in the Emergency Room with some improvement in her mental status; however, she would drift down back to sleep. She was started on Narcan drip and hospitalist services were contacted for admission. The patient herself is complaining of some fatigue and weakness, which seems to be chronic, also pain she describes in lower back as well as in her hips. She does note that she uses at least 40 mg of OxyContin daily for her chronic pain. She also admits to constipation and has been having problems with left lower quadrant abdominal pain for the past 4 or 5 months now due to constipation. She does not remember when she had any good bowel movement. She is not sure why she is in the hospital in the Emergency Room.   PAST MEDICAL HISTORY: Significant for history of admission in January 2013 for overdose with insulin, multiple suicidal attempts in the past, history of bipolar disorder, depression, recurrent cocaine dependence, history of diabetes mellitus, insulin-dependent, neuropathy, pulmonary embolism on Coumadin therapy, history of seizure disorder, gastroesophageal reflux disease, asthma, fibromyalgia, hyperlipidemia, obesity and previous drug abuse in the past.   PAST SURGICAL HISTORY: Tubal ligation, right knee surgery as well as carpal tunnel syndrome.   ALLERGIES: SULFA.   FAMILY HISTORY: Father has  lung cancer.   SOCIAL HISTORY: The patient lives with her family. She smokes about 1/2 pack a day according to the medical records. Also, no alcohol history. History of substance abuse in the past as well as cocaine abuse and it is difficult to say at this point when she used it last. She admits to having opiate abuse also and apparently she is on Dilaudid intermittently and she requires high doses of pain medications for her and back pains, but she was not on Dilaudid actually at home.   MEDICATIONS: According to medical records as follows:  1.  Acetaminophen/oxycodone 325 mg/5 mg tablet 1 to 2 tablets every 4 to 6 hours as needed.  2.  Actos 45 mg p.o. daily.  3.  Albuterol CFC free 2 puffs 4 times daily as needed.  4.  Amlodipine 10 mg p.o. daily.  5.  BC powder, one packet every 6 hours as needed.  6.  Epitol 200 mg p.o. twice daily.  7.  Glimepiride 2 mg p.o. daily.  8.  Keppra 500 mg p.o. twice daily.  9.  Klonopin 1 mg p.o. 3 times daily as needed.  10. Lantus 40 mg subcutaneously at bedtime.  11. Lipitor 80 mg p.o. daily.  12. Lyrica 75 mg p.o. twice daily.  13. Pantoprazole 40 mg p.o. twice daily.  14. Saphris 10 mg p.o. or sublingually daily at dinner.  15. Slow release iron 160 mg p.o. once a day.  16. Symbicort 160/4.5 mg 2 puffs twice daily.  17. Zanaflex 6 mg p.o. every 8 hours as needed.   REVIEW OF SYSTEMS:  CONSTITUTIONAL: Difficult to obtain as the patient very  sleepy and intermittently goes to sleep. She admits of having some fatigue and weakness, which seems to be more chronic. She admits also of lumbar lower back as well as hip pains for which she uses at least 40 mg of oxycodone at home. She also admits to constipation as well as left lower quadrant abdominal pains for the past 4 to 5 months. Denies any fevers or chills weight loss or gain.  EYES: In regards to eyes, denies any blurry vision, double vision, glaucoma, or cataracts.  ENT: Denies any tinnitus, allergies,  epistaxis, sinus pain, dentures or difficulty swallowing.  RESPIRATORY: Denies any cough, wheezes, asthma or COPD.  CARDIOVASCULAR: Denies any chest pains, orthopnea, arrhythmias, palpitations or syncope.  GASTROINTESTINAL: Denies nausea, vomiting or diarrhea.  GENITOURINARY: Denies dysuria, hematuria, frequency or incontinence.  ENDOCRINOLOGY: Denies polydipsia, nocturia, thyroid problems, heat or cold intolerance or thirst.  HEMATOLOGIC: Denies anemia, easy bruisability, bleeding or swollen glands.  SKIN: Denies acne, rashes, lesions or change in moles.  MUSCULOSKELETAL: Denies arthritis, cramps or swelling.  NEUROLOGIC: No numbness, epilepsy or tremor.  PSYCHIATRIC: Denies anxiety, insomnia or depression.   PHYSICAL EXAMINATION:   VIAL SIGNS: On arrival to the hospital, temperature is not measured, pulse 64, respiratory rate 14, blood pressure 110/68 and saturation 98% on room air.  GENERAL: This is a well-developed, well-nourished, obese, Caucasian female in no significant distress lying on the stretcher. She is very sleepy.  HEENT: She is able to open her eyes and briefly move around, but otherwise she drifts back to sleep. Her pupils are equal and reactive to light. Extraocular movements intact. No icterus or conjunctivitis. Has normal hearing. No pharyngeal erythema. Mucosa is dry.  NECK: No masses, supple, nontender. Thyroid not enlarged. No adenopathy. No JVD or carotid bruits bilaterally. Full range of motion.  LUNGS: The patient does have rales as well as rhonchi bilaterally in both lung fields anteriorly. No diminished breath sounds or wheezing. No labored inspirations, increased effort, dullness to percussion or overt respiratory distress.  CARDIOVASCULAR: S1, S2 appreciated. No murmurs, gallops or rubs noted. PMI is not enlarged. Chest is nontender to palpation. Good vascular pulses. No lower extremity edema, calf tenderness or cyanosis was noted.  ABDOMEN: Protuberant, soft, no  significant tenderness. No hepatosplenomegaly or masses were noted.  RECTAL: Deferred.  MUSCULOSKELETAL: Able to move all extremities; however, very weak and not a consistent effort. No cyanosis or degenerative joint disease. Not able to assess for kyphosis. Gait is not tested.  SKIN: Did not reveal any rashes, lesions, erythema, nodularity or induration. It was warm and dry to palpation.  LYMPH: No adenopathy in the cervical region.  NEUROLOGICAL: Cranial nerves grossly intact. Sensory is intact. No dysarthria or aphasia. The patient is alert and oriented to time, person, place. She is poorly cooperative. The patient is very somnolent, not able to assess for orientation well; however, the patient is able to orient herself in place as well as person, poorly cooperative. Memory is good.  PSYCHIATRIC: No significant confusion, agitation or depression noted.   LABORATORY DATA: Glucose 292, otherwise unremarkable BMP; however, her potassium level is elevated at 5.7. The patient's bicarbonate level is high at 33, magnesium level of 1.5. Alcohol level less than 3. Albumin level of 3.0, otherwise unremarkable study. TSH normal at 2.15. Lithium level was less than 0.2. Valproic acid level less than 3. Urine drug screen is pending. White blood cell count is elevated to 14.9, hemoglobin 11.6, platelet count 369. Coagulation panel unremarkable. Urinalysis: Yellow, hazy  urine, negative for glucose, bilirubin, or ketones, specific gravity 1.013, pH 6.0, negative for blood, protein, nitrites or leukocyte esterase, less than 1 red blood cell, less than 1 white blood cell, trace bacteria and no epithelial cells were noted. Acetaminophen level less than 2. Salicylate level elevated at 27.7. ABGs were done on room air and showed pH of 7.40, pCO2 46, pO2 92, saturation 97.1% on room air.   RADIOLOGIC STUDIES: Chest x-ray looks like she has cardiomegaly as well as some questionable congestion versus pleural effusion;  difficult to see because of her obesity and poor inspiratory effort. EKG showed sinus rhythm with occasional and consecutive premature ventricular complexes at rate of 72 beats per minute, normal axis and no acute ST-T changes, however, were noted.   ASSESSMENT AND PLAN:  1.  Metabolic encephalopathy due to opiate as well as benzodiazepine overdose: Questionable intentional suicidal ideation. Admit the patient to the medical floor. Continue Narcan. Follow neurochecks. Get a psychiatrist involved for further recommendations.  2.  Diabetes mellitus: Continue patient on Lantus as well as sliding scale insulin.  3.  Hyperkalemia: The patient will be given some intravenous fluids. We will reassess potassium level shortly.  4.  Hypomagnesemia: We will supplement intravenous.  5.  Leukocytosis: Start the patient on antibiotic therapy with Levaquin intravenous to cover for aspiration pneumonitis.  6.  Elevated salicylate level: We will continue the patient on intravenous fluids and will make decisions if we need to add bicarbonate depending on her clinical status.  7.  History of gastroesophageal reflux disease: Continue patient on proton pump inhibitors.  8.  History of seizure disorder: Continue patient on Keppra.  9.  History of bipolar disorder: Continue outpatient medications.  10. History of hyperlipidemia. Continue Lipitor. 11. History of chronic obstructive pulmonary disease: Continue the patient on inhalation therapy as well as oxygen as needed.   TIME SPENT: 1 hour and 15 minutes on the patient.  ____________________________ Theodoro Grist, MD rv:aw D: 06/17/2012 19:56:19 ET T: 06/18/2012 06:50:01 ET JOB#: [id number]  cc: Theodoro Grist, MD, <Dictator> Youlanda Roys. Lovie Macadamia, MD Theodoro Grist MD ELECTRONICALLY SIGNED 07/24/2012 13:47

## 2014-10-23 NOTE — H&P (Signed)
PATIENT NAME:  Kelsey Sanchez, Kelsey Sanchez MR#:  [id number] DATE OF BIRTH:  30-Jun-1966  DATE OF ADMISSION:  04/10/2013  PRIMARY CARE PHYSICIAN: Dr. Clemmie Krill.   REFERRING PHYSICIAN: Dr. Joni Fears.   CHIEF COMPLAINT: Altered mental status and hypoglycemia.   HISTORY OF PRESENT ILLNESS: [name] is a 49 year old Caucasian female with past medical history of bipolar disorder not otherwise specified, depression, history of multiple suicide attempts including insulin overdose in January 2013, seizure disorder, type 2 diabetes which is insulin requiring who is presenting with confusion. Apparently, according to her family member Kelsey Sanchez who is her son at bedside, she has been having low blood glucose for approximately the last 2 days with associated confusion, described by him as she simply seemed off and slower than usual. While the patient was at home earlier today, they noted her to be more confused. Someone checked her blood glucose and found it to be 21. EMS arrived and gave 1 amp of D50. Her glucose responded to the low 110s. While in the Emergency Department, she dipped back down with blood glucose into the 20s once again and then started on a D5 drip. Currently, she is somnolent but easily arousable to verbal command and without complaint, though she is unable to provide any further information.   REVIEW OF SYSTEMS: Unable to obtain at this time secondary to the patient's mental status.   PAST MEDICAL HISTORY: As obtained from medical record, history of insulin overdose in January 2013, multiple suicide attempts, bipolar disorder, depression, recurrent cocaine dependence, history of type 2 diabetes which is insulin requiring, neuropathy, history of pulmonary embolism on Coumadin therapy, history of seizure disorder, gastroesophageal reflux disease, asthma, fibromyalgia, hyperlipidemia, obesity.   PAST SURGICAL HISTORY: Tubal ligation, right knee surgery and carpal tunnel.   FAMILY HISTORY: Father with lung  cancer.   SOCIAL HISTORY: Lives with her family. Current tobacco abuse and documentation ranges between 1/2 pack to 3 packs daily. No alcohol use. History of substance abuse including cocaine and apparently also abused opiates.   ALLERGIES: SULFA.   HOME MEDICATIONS: Albuterol 90 mcg inhalation 2 puffs 4 times daily as needed for shortness of breath, Norvasc 5 mg p.o. daily, aspirin 81 mg p.o. daily, BC 845/65 mg oral powder q.6 hours as needed for pain, Epitol 200 mg 1 tab in the morning and 2 tabs in the p.m., iron 50 one tab p.o. daily, glimepiride 2 mg p.o. daily, hydrochlorothiazide 20 mg p.o. daily, Keppra 500 mg p.o. b.i.d., Klonopin 1 tab p.o. 4 times daily, Lantus 40 units subcutaneous injection twice daily, lidocaine viscous 2% mucous membrane solution 5 mL mucous membrane 4 times daily before meals and at bedtime, Lipitor 80 mg p.o. at bedtime, Lyrica 75 mg p.o. b.i.d., metformin 850 mg p.o. t.i.d., pantoprazole 20 mg p.o. b.i.d., Saphris 5 mg sublingual tablet 3 tabs at bedtime, Symbicort 160/4.5 mcg inhalation 2 puffs b.i.d., temazepam 30 mg p.o. at bedtime, vitamin C 1000 mg p.o. daily, Zanaflex 6 mg p.o. q.8 hours as needed.   PHYSICAL EXAMINATION:  VITAL SIGNS: Temperature 97.9, heart rate 61, respirations 22, blood pressure 126/58, saturating 98% on 2 liters supplemental O2, weight 104.3 kg, BMI 42.1.  GENERAL: Obese Caucasian female who appears somnolent though easily arousable.  HEAD: Normocephalic, atraumatic.  EYES: Pupils equal, round and react to light. Extraocular muscles intact. There is no scleral injection.   MOUTH: Moist mucosal membranes. Poor dentition. No abscess noted.  EARS, NOSE, THROAT: Throat clear without exudate. No external lesions.  NECK: Supple.  No thyromegaly or nodules. No JVD.  PULMONARY: Clear to auscultation bilaterally. No wheezes, rubs or rhonchi. No use of accessory muscles. Good respiratory effort. The breath sounds are decreased throughout secondary  to body habitus.  CHEST: Nontender to palpation.  CARDIOVASCULAR: S1, S2, regular rate and rhythm. No murmurs, rubs or gallops. No edema. Pedal pulses 2+.  GASTROINTESTINAL: Soft, obese, nontender, nondistended. No masses. No hepatosplenomegaly. Positive bowel sounds.  MUSCULOSKELETAL: No swelling, edema or clubbing. Range of motion full in all extremities.  NEUROLOGIC: Cranial nerves II through XII intact. Sensation and reflexes intact.  SKIN: No ulcerations, lesions or rashes. Skin is warm and dry. Turgor intact.  PSYCHIATRIC: Mood and affect: The patient is somnolent, though easily arousable. Unable to answer questions appropriately. She is arousable to verbal stimuli, though essentially only answers yes or no to questions. Her insight and judgment are poor.   LABORATORY DATA: Sodium 138, potassium 4.1, chloride 110, bicarb 23, BUN 35, creatinine 1.06, glucose 78. Lipase 135, total protein 7.9, albumin 3.2, bilirubin 0.4, alk phos 146, AST 123, ALT 124. Troponin I less than 0.02. WBC 10.6, hemoglobin 13.1, platelets 353. Urinalysis negative for signs of infection.   ASSESSMENT AND PLAN: A 49 year old Caucasian female with history of bipolar disorder, depression, multiple suicide attempts and seizure disorder as well as diabetes who is presenting with confusion. She was found to have a low blood glucose of 21. She was given 1 amp of D50, though despite this, her blood glucose returned back to the 20s. She is now on a D5 drip. She is somnolent but easily arousable to verbal stimuli. When she is awake, she is unable to really add further information. She only answers yes or no.  1. Refractory hypoglycemia: It is unknown at this time whether this was a suicide attempt. Will check Accu-Cheks q.1 hour. Hold all antidiabetic medications including insulin. She has been started on D5 drip. Will add octreotide as she is on sulfonylurea at home as well. Will place on suicide precautions until she is able to tell  us otherwise.  2. Acute encephalopathy in the setting of hypoglycemia: Will check an ABG to rule out CO2 narcosis given body habitus and obesity. If present, will place on BiPAP. Will avoid further sedating agents until mental status improves.  3. Hepatic steatosis with elevated LFTs: Will need outpatient followup for this.  4. Seizure disorder: Continue Tegretol, Klonopin, Keppra.  5. Depression and bipolar disorder not otherwise specified: Continue asenapine.  6. Chronic obstructive pulmonary disease: Supplemental oxygen as required to keep oxygen saturation greater than 92%. Continue Symbicort.  7. Gastroesophageal reflux disease: Continue pantoprazole.  8. Hypertension: Continue Norvasc and hydrochlorothiazide.  9. Venous thromboembolism prophylaxis: Heparin subcutaneous.   She is FULL CODE.   TIME SPENT: 55 minutes.   ____________________________ Aaron Mose. Hower, MD dkh:gb D: 04/10/2013 03:01:37 ET T: 04/10/2013 04:22:57 ET JOB#: [id number]  cc: Aaron Mose. Hower, MD, <Dictator> DAVID Woodfin Ganja MD ELECTRONICALLY SIGNED 04/10/2013 08:65

## 2014-10-23 NOTE — Consult Note (Signed)
PATIENT NAME:  Kelsey Sanchez, Kelsey Sanchez MR#:  [id number] DATE OF BIRTH:  December 10, 1965  DATE OF CONSULTATION:  04/10/2013  CONSULTING PHYSICIAN:  Gonzella Lex, MD  IDENTIFYING INFORMATION AND REASON FOR CONSULT:  This is a 49 year old woman who came into the hospital after calling EMS and it being found that she had a very low blood sugar. Concern was raised about the potential of her to have overdosed on insulin.   HISTORY OF PRESENT ILLNESS: Information obtained from the patient and the chart. The patient states that yesterday she had been feeling sick for a couple of days, feeling run down and tired more than usual. She was checking her blood sugar during the day and found that it had been going up and down irregularly, claims that at 1 point was in the 50s and then shortly thereafter it was over 400, and then later it was checked and was down in 20s. They called EMS, who came to the house and confirmed that her blood sugar was in the 20s. The patient tells me that she did not make any change in her normal medication regimen. She specifically denies using any extra insulin than her usual standing dose. She also denies that she took any extra oral medicines for diabetes or had any change at all in her medicines. She denies that she had had any change in her diet and said that she had been eating regularly recently. She had not had any new physical symptoms that had warranted changing medicines except for the recent feeling of being very tired. She says that her mood had been actually reasonably good, not fantastic, but also not depressed. She had not been having a lot of negative thoughts. She has multiple positive things in her life that she values including her grandson and her relationship with her sons and the rest of her family. She says that she enjoys many things in her life. She chronically has some interrupted sleep, but that has been a long-standing condition. Her appetite and diet have been the same as  usual. She denies having any psychotic symptoms recently. Denies any recent sense of hopelessness. The patient admits that she continues to use marijuana on a frequent basis. When confronted with the opiate-positive drug screen, she claims that a couple of days ago she took a single opiate pain medicine because she suffered a fall on the floor but is not abusing opiates otherwise.   PAST PSYCHIATRIC HISTORY: The patient is a regular patient of Dr. Kasandra Knudsen. She is currently taking Brintellix  20 mg per day and also takes clonazepam 1 mg 4 times a day and temazepam 30 mg at night. She has had a suicide attempt in the past by overdose of insulin in January of 2013 which raised the concern about possible suicidality. At that time, she was admitting to having relapsed into cocaine use which she thought was a trigger for her suicidality. She says that since then she has not been using cocaine. She does not report any other suicide attempts. She has been diagnosed in the past with bipolar disorder type 2 or borderline personality disorder. She feels like her current medication is working pretty well. She says about 2 months ago Dr. Kasandra Knudsen discontinued her Saphris because he thought she was doing so well, and she has not noticed any worsening since then.   SUBSTANCE ABUSE HISTORY: History of cocaine dependence, marijuana dependence and other prescription drug abuse intermittently. She says that she has been able to stay  sober for extended periods of time and most recently has been staying sober. She denies that she is abusing opiates currently. She freely admits to marijuana use which she seems to accept as a normal thing and does not see as a problem.   PAST MEDICAL HISTORY: The patient is overweight and has diabetes. She uses oral medications as well as a standing dose of 60 units of insulin at night. She has a history of a seizure disorder, and asthma, and dyslipidemia and chronic pain from neuropathy.   SOCIAL HISTORY:  The patient is disabled, lives with her husband and 2 adult sons as well as intermittently living with the family of another one of her sons. She says that her home life is happy. Denies that there has been any stress or tension among her and her family members. She does not report any new particular stress in her life.   REVIEW OF SYSTEMS: Says she has been feeling tired and run down and worn out recently but denies that her mood has been sad, denies any psychotic symptoms, denies suicidal or homicidal ideation.   CURRENT MEDICATIONS: See full list of all of her medicines, but she is taking Tegretol 200 mg twice a day for seizures and also for mood stability, Klonopin 1 mg 4 times a day, amlodipine 5 mg a day, hydrochlorothiazide 25 mg per day, Keppra 500 mg twice a day, Sandostatin 50 mcg q. 6 hours, pantoprazole 40 mg a day, Lyrica 75 mg twice a day, pantoprazole 40 mg b.i.d., (Dictation Anomaly) << MISSING TEXT>> aspirin 81 mg a day, heparin 5000 units q. 8 hours at least here in the hospital, NovoLog sliding scale insulin pen 60 units at night, Symbicort inhaler 2 puffs twice a day and Brintellix 20 mg a day.   ALLERGIES: SULFA DRUGS.   MENTAL STATUS EXAM: Mildly sick-appearing woman who was wide awake and in her hospital bed and cooperative with the interview. She was pleasant and not surprised to see me. Eye contact good. Psychomotor activity sluggish and slow. Speech slow but normal in tone. Affect slightly blunted. Mood stated as being okay. Thoughts are lucid without any sign of loosening of associations or delusions. No evidence of bizarre thinking. Denies any hallucinations. Completely denies suicidal or homicidal ideas. Says she has not had any recent worsening of her stresses and can name many positive things that she has to live for. Insight and judgment impaired, especially regarding her substance use, but otherwise seems to be reasonably intact. Intelligence average. Alert and oriented x 4.    LABORATORY RESULTS: Opiates are positive in the drug screen. Interestingly, benzodiazepines are negative and cannabis is negative, both of which would not be expected given her usual medications. I do not know how exactly to explain that.   ASSESSMENT: A 49 year old woman with a history of some mood instability and substance abuse but who recently reports that she had been stable without any new problems. She presented hypoglycemic to the hospital. At no point did there seem to be any reason to think that she had taken an overdose. She has consistently denied it. She denies it to me now. She is not reporting a depressive syndrome. In the past when I saw her, she was very willing to admit to her behavior and to her mood symptoms. She has many positive things in her life that she values. At this point, I think that the risk of suicidal behavior is low. She can be taken off of the suicide  watch. I have put in an order to restart her Brintellix, even though it is a nonformulary medicine. She has outpatient treatment already set up with Dr. Kasandra Knudsen. Some counseling done regarding substance abuse, but as she is denying that she abuses anything except for the marijuana, which she does not see as a problem, she does not feel like there is a need to make any acute changes. I will try and follow up with her tomorrow. I tried to call her family on several of the phone numbers listed in the chart and was not able to reach anyone at all.   DIAGNOSIS, PRINCIPAL AND PRIMARY:  AXIS I: Bipolar disorder type 2, currently stable.   SECONDARY DIAGNOSES: AXIS I:  1.  Cocaine dependence, in remission.  2.  Marijuana dependence.   AXIS II: Deferred.   AXIS III:  1.  Obesity.  2.  Diabetes.  3.  Recent hypoglycemia.  4.  Dyslipidemia.  5.  Chronic obstructive pulmonary disease.  6.  History of pulmonary embolism.  AXIS IV: Moderate-to-severe from financial problems and chronic burden of illness.   AXIS V: Functioning  at time of evaluation 57.   ____________________________ Gonzella Lex, MD jtc:cb D: 04/10/2013 16:20:03 ET T: 04/10/2013 16:35:50 ET JOB#: [id number]  cc: Gonzella Lex, MD, <Dictator> Gonzella Lex MD ELECTRONICALLY SIGNED 04/11/2013 11:18

## 2014-10-23 NOTE — Consult Note (Signed)
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   Patient was seen by consultant.   Consult note dictated.   Orders entered.   Comments: Psychiatry: Patient seen. Note dictated patient denies having made any intentional overdose and denies any suicidal intent or thought. Dose not report major depression. Currently lucid. Suicide precautions sitter can be dced.  Electronic Signatures: Gonzella Lex (MD)  (Signed 09-Oct-14 16:05)  Authored: Brief Consult Note   Last Updated: 09-Oct-14 16:05 by Gonzella Lex (MD)

## 2014-10-23 NOTE — Consult Note (Signed)
PATIENT NAME:  Kelsey Sanchez, Kelsey Sanchez MR#:  [id number] DATE OF BIRTH:  1965-12-04  DATE OF CONSULTATION:  04/10/2013  REFERRING PHYSICIAN:  Fritzi Mandes, M.D.   DICTATING CONSULTANT:  Sherlon Handing, MD  CHIEF COMPLAINT:  Hypoglycemia.   HISTORY OF PRESENT ILLNESS:  This is a 49 year old female seen in consultation at the request of Dr. Posey Pronto for hypoglycemia in the setting of long-standing diabetes. She has had diabetes since 2005. Her outpatient diabetes regimen includes metformin 850 mg b.i.d., Actos 45 mg daily, glimepiride 4 mg daily, and Lantus 60 units at bedtime. Last evening while at home with her family, after supper she was acting confused and the family checked her blood sugar and found it to be low. EMS was called and also confirmed a low blood sugar and provided her with IV dextrose and brought her to the ED. Due to persistent hypoglycemia, she was admitted initially to the CCU. She was initiated on IV dextrose and due to improvement in blood sugars, IV dextrose was able to be stopped today and she was transferred to the ward. She does not recall the events of the last evening. She recalls the events of the course of yesterday, which included interacting with family members, running errands, and doing her typical activities. She had not yet taken her evening Lantus at the time of the event had occurred. She reports she had taken her morning metformin, morning Actos, and glimepiride as per usual routine. She had not yet taken her evening metformin. She states she was not feeling depressed or down and did not take any extra medication. She states she had a good diet and had not skipped meals. She denies having had recent illness or infection and also denies nausea or vomiting. She states blood sugars are typically fairly erratic and range anywhere from 40 to 400. When blood sugars are in the 40s, she does typically have symptoms. She does not have a primary care physician at this time. She was  previously a patient of Dr. Bernie Covey, however, states that her insurance is no longer accepted at that clinic. She believes her last visit with Dr. Clemmie Krill was over the summer and at that time her hemoglobin A1c was around 11%. Diabetes is complicated by peripheral neuropathy. Peripheral neuropathy is manifested as foot pain and this is treated with Lyrica. She did have a prior hospitalization in 2012, for hypoglycemia attributed to insulin overdose as a suicidal attempt. She does have long-standing depression.   PAST MEDICAL HISTORY:  1.  Morbid obesity.  2.  Depression.  3.  Bipolar disorder.  4.  Type 2 diabetes.  5.  History of pulmonary embolism.  6.  Seizure disorder.  7.  Peripheral neuropathy. 8.  GERD.  9.  Asthma.  10.  Hyperlipidemia.  11.  Chronic pain.  12.  Tobacco dependence.  PAST SURGICAL HISTORY:  1.  Tubal ligation.  2.  Right knee surgery.  3.  Carpal tunnel repair.   FAMILY HISTORY:  Positive for diabetes.   SOCIAL HISTORY:  The patient lives with her family, including her husband and two sons. She smokes about 10 cigarettes per day. She has been trying to cut back on her cigarette smoking. She denies any alcohol use.   ALLERGIES:  SULFA.  INPATIENT MEDICATIONS:  1.  Amlodipine 5 mg daily.  2.  Tegretol 200 mg b.i.d.  3.  Klonopin 1 mg q.i.d.  4.  Hydrochlorothiazide 25 mg daily.  5.  Keppra 500 mg q.12  hours.  6.  Sandostatin 50 mcg q.6 hours.  7.  Pantoprazole 40 mg daily.  8.  Lyrica 75 mg b.i.d.  9.  Pantoprazole 40 mg b.i.d.  10.  Aspirin 81 mg daily.  11.  Heparin 5000 units q.8 hours.  12.  NovoLog insulin sliding scale.  13.  Symbicort 160/4.5, 2 puffs b.i.d.   PHYSICAL EXAMINATION; GENERAL:  No fevers. No weight loss.  HEENT:  No blurred vision. No sore throat.  NECK:  Denies neck pain, denies dysphagia.  CARDIAC:  Denies chest pain or palpitations.  PULMONARY:  Denies cough. She has had shortness of breath.  ABDOMEN:  Denies  abdominal pain. Appetite is fair.  EXTREMITIES:  Denies leg swelling.  SKIN:  Denies rash or recent skin changes.  GENITOURINARY: Denies dysuria or hematuria.   LABORATORY, DIAGNOSTIC, AND RADIOLOGICAL DATA:  October 8 at 10:00 p.m. glucose 78, BUN 35, creatinine 1.06, chloride 110, bicarb 23. EGFR greater than 60, lipase 135, AST 123, ALT 124. Troponin I less than 0.02. Urine drug screen positive for opiates. Hematocrit 38.8%. UA showed 3+ blood, negative ketones, negative glucose, negative protein.   ASSESSMENT:  A 49 year old female admitted with severe hypoglycemia and altered mental status. Metabolic encephalopathy likely due to hypoglycemic state. Cause of hypoglycemia is not clear, however given history of erratic blood sugars, this suggested medications may be excessive or eating habits are not consistent. Altered mental status has resolved as blood sugars have normalized. Her IV dextrose has been stopped and actually most recent blood sugars have been in the 300s. She has long-standing type 2 diabetes, chronically uncontrolled, complicated by peripheral neuropathy.   RECOMMENDATIONS:  1.  Agree with stopping her IV dextrose.  2.  We will restart metformin 850 mg b.i.d. and Actos 45 mg daily.  3.  Need to investigate use of Sandostatin, this medication can certainly contribute to both hyper and hypoglycemia.  4.  Encouraged good oral nutrition.  5.  Continue regular monitoring of blood sugars, before meals, at bedtime and at 2:00 a.m. should be adequate. We will continue her current sliding scale for now.  7.  We will consider adding back to basal insulin if hyperglycemia is persistent.   Thank you for the kind request for a consultation. I will follow along with you.   ____________________________ A. Lavone Orn, MD ams:jm D: 04/10/2013 14:13:27 ET T: 04/10/2013 14:47:28 ET JOB#: [id number]  cc: A. Lavone Orn, MD, <Dictator> Sherlon Handing MD ELECTRONICALLY SIGNED 04/15/2013  12:54

## 2014-10-23 NOTE — Consult Note (Signed)
Allergies:  Sulfa drugs: Unknown  Assessment/Plan:  Assessment/Plan 49 yo F seen in consultation at the request of Dr. Posey Pronto for severe hypoglycemia and altered MS. Pt was intereviewed, examined, and chart was reviewed. Sugars now high (300+) and she's been off the IV Dextrose since this morning.  A/ Type 2 diabetes with neurologoc complications, uncontrolled Diabetic peripheral neuropathy Altered mental status due to hypoglycemia, resolved Depression and h/o suicide attempts  P/ Add metformin 850 mg bid Add pioglitazone 45 mg daily Continue qACHS FSBS and NovoLog SSI, as you are Will consider adding back Lantus  Avoid use of sulfonylurea for now   Electronic Signatures: Judi Cong (MD)  (Signed 09-Oct-14 14:21)  AuthoredFabienne Bruns, Assessment/Plan   Last Updated: 09-Oct-14 14:21 by Judi Cong (MD)

## 2014-10-24 NOTE — H&P (Signed)
PATIENT NAME:  Kelsey Sanchez, FERRIE MR#:  [id number] DATE OF BIRTH:  Sep 24, 1965  DATE OF ADMISSION:  01/02/2014  PRIMARY CARE PHYSICIAN: Enid Derry, MD  HISTORY OF PRESENT ILLNESS: The patient is a 49 year old Caucasian female with a past medical history significant for history of COPD, history of diabetes, hypertension, obesity, history of admission for gastroenteritis in April 2015, who presents to the hospital unresponsive. Apparently patient's family brought her to the hospital. She was noted to be severely hypotensive, with systolic blood pressure 12I/78M. On arrival to the hospital she was unresponsive. Her urine drug screen came back positive for multiple medications including benzodiazepines, cannabinoids as well as opiates. She was given Narcan and she is somewhat improved. Upon improvement, she told the Emergency Room physician that she took 5 tablets of 2 mg Dilaudid. She was initiated on Narcan drip and she is more arousable now. She is able to converse whenever she is shaken. For some period of time she has been snoring and she is not moving here. Her ABGs, however, are not significantly abnormal. Her labs revealed acute renal failure with creatinine level of 3.4, sodium 130, severe hyperglycemic with the glucose levels on Accu-Cheks above 400. Also elevated transaminases, elevated CK levels as well as elevated troponin to 0.28. Leukocytosis with white blood cell count 19.6.   Hospitalist services were contacted for admission.   PAST MEDICAL HISTORY: Significant for history of admission in April 2015 for acute gastroenteritis, history of COPD, diabetes mellitus, hypertension, and significant morbid obesity, seizure disorder, bipolar disorder not otherwise specified, depression not otherwise specified, history of cocaine abuse, gastroesophageal reflux disease, asthma, hyperlipidemia, remote history of pulmonary embolism not on anticoagulation. According to medical records, the patient is an every  day smoker. She uses alcohol occasionally apparently. Positive for marijuana use.   FAMILY HISTORY: Lung cancer in the family.   ALLERGIES: SULFA MEDICATIONS  REVIEW OF SYSTEMS: Not available. The patient denies any significant discomfort, however upon questioning admits of some pains in the back as well as her hips, but then drifts back down to sleep.   PHYSICAL EXAMINATION: VITAL SIGNS: On arrival to the hospital the patient's temperature was 97.9, pulse was 60, respirations 20, blood pressure initially 65/41. Saturation 98% on room air. At time of my evaluation, she is on oxygen therapy and her oxygen saturation is 100% on room air.  GENERAL: This is a well-developed, obese Caucasian female in no significant distress, lying on the stretcher.  HEENT: Her pupils are equal, reactive to light. Extraocular muscles intact, no icterus or conjunctivitis. Has normal hearing. No pharyngeal erythema. Mucosa is moist.  NECK: No masses. Supple, nontender. Thyroid is not enlarged. No adenopathy. No JVD or carotid bruits bilaterally. Full range of motion.  ENT:  The patient does have with some injury and restoration on the bridge of her nose.   LUNGS: Diminished breath sounds and poor air entry, especially when she snores. She does not seem to be moving too much air. No rales, rhonchi or wheezing.  No labored inspiration, increased effort, dullness to percussion or overt respiratory distress. CARDIOVASCULAR: S1, S2 appreciated. Rythm is regular, no murmurs, galops or rubs.  CHEST:  Nontender to palpation.  EXTREMITIES: 1+ pedal pulses. No lower extremity edema, calf tenderness or cyanosis was noted.  ABDOMEN: Protuberant, soft, nontender. Bowel sounds are present. No hepatosplenomegaly or masses were noted.  RECTAL: Deferred.  MUSCLE STRENGTH: Able to move all extremities, however, the patient is very poorly cooperative. No cyanosis. Not able to  assess for kyphosis. Gait is not tested.  SKIN: Did not reveal  any rashes, lesions, erythema, nodularity or induration. It was warm and dry to palpation.  LYMPHATIC: No adenopathy in the cervical region.  NEUROLOGICAL: Grossly intact, however, difficult to obtain or evaluate. Not able to assess her sensory. She is somewhat dysarthric. She is very somnolent, briefly awakened, able to converse and then drifts back to sleep and snores. Not cooperative   LABORATORY DATA: BMP showed glucose 391, BUN and creatinine were 29 and 3.42, sodium 130; otherwise, BMP was unremarkable. The patient's estimated GFR for non-African American would be 15. The patient's alcohol level less than 3. Albumin level of 2.5, AST elevated to 104. CK total of 4111. MB fraction of 41.6, and troponin 0.28. Urine drug screen was positive for benzodiazepines, cannabinoids as well as opiates. White blood cell count is elevated to 19.2, hemoglobin 11.7, platelet count 299,000. Urinalysis: Amber cloudy urine, 50 mg/dL for glucose, negative for bilirubin or ketones. Specific gravity 1.018. PH was 5.0, negative for blood, protein, nitrites or leukocyte esterase, 0 to 5 red blood cells, as well as white blood cells, 1+ bacteria, 5 to 15 epithelial cells. ABGs were done on 28% FiO2, pH 7.34, pCO2 was 52. Oxygen saturation 97.7%, pO2 was 109. Lactic acid level 1.0.   RADIOLOGIC STUDIES: Chest x-ray, portable single view January 03, 2104, showed no active disease. CT scan of head without contrast on January 02, 2014, revealed no evidence of acute intracranial pathology.   ASSESSMENT AND PLAN: 1. Acute renal failure. Admit the patient to the medical floor. Continue her on high rate IV fluids, likely due to rhabdomyolysis as well as possibly hypertension.  2. Hypotension. Continue IV fluids, add Levophed if needed.  3. Hyponatremia. Follow up with IV fluid administration.  4. Rhabdomyolysis, high rate IV fluids. Follow CK levels.  5. Elevated troponin, likely demand ischemia. I will continue fluids, aspirin therapy  if she is able to take p.o., as well as heparin subcutaneously. Follow cardiac enzymes x3. Will get cardiologist involved for further recommendations.  6. Elevated white blood cell count, rule out aspiration. We will repeat chest x-ray in the morning.  7. Acute hypoxic hypercarbic respiratory failure. We will start the patient on CPAP at present since she resembles obstructive sleep apnea breathing pattern. Will be admitting the patient to the  critical care unit.  8. Multiple drug overdose including opiates. The patient is going to be continued on Narcan drip at present.  9. Anemia. Get guaiac.  Time spent 1 hour 15 minutes on this patient.   ____________________________ Theodoro Grist, MD rv:dw D: 01/02/2014 20:15:02 ET T: 01/02/2014 20:40:42 ET JOB#: [id number]  cc: Theodoro Grist, MD, <Dictator> Enid Derry, MD Talmo MD ELECTRONICALLY SIGNED 01/22/2014 15:28

## 2014-10-24 NOTE — H&P (Signed)
PATIENT NAME:  Kelsey Sanchez, Kelsey Sanchez MR#:  [id number] DATE OF BIRTH:  1966-01-06  DATE OF ADMISSION:  10/14/2013  REFERRING PHYSICIAN: Sheryl L. Benjaman Lobe, MD  PRIMARY CARE PHYSICIAN: Enid Derry, MD  CHIEF COMPLAINT: Nausea, vomiting.   HISTORY OF PRESENT ILLNESS: A 49 year old Caucasian female with history of diabetes, gastroesophageal reflux disease, COPD presenting with nausea and vomiting. Describes 1 week duration of nausea and vomiting of about 3-4 bouts daily of nonbloody, nonbilious emesis with associated abdominal cramping; however, denies any frank abdominal pain. Denies any further gastrointestinal symptoms, including diarrhea or constipation. Denies any fevers, chills, recent sick contacts, travel, or household members with similar illnesses. Currently, she has no complaints.   REVIEW OF SYSTEMS:  CONSTITUTIONAL: Denies fevers, chills. Positive for generalized fatigue. EYES:  Have blurred vision, double vision, eye pain.  HEENT: Denies tinnitus, ear pain, hearing loss.  RESPIRATORY: Denies cough, wheeze, shortness of breath.  CARDIOVASCULAR: Denies chest pain, palpitations, edema.  GASTROINTESTINAL: Positive for nausea, vomiting. Denies any diarrhea or abdominal pain.  GENITOURINARY: Denies dysuria or hematuria.  ENDOCRINE: Denies nocturia or thyroid problems.  HEMATOLOGIC AND LYMPHATIC: Denies easy bruising, bleeding.  SKIN: Denies rashes or lesions.  MUSCULOSKELETAL: Denies pain in neck, back, shoulder, knees, hips, or arthritic symptoms.  NEUROLOGIC: Denies paralysis, paresthesias.  PSYCHIATRIC: Denies anxiety or depressive symptoms.  Otherwise, full review of systems performed by me is negative.   PAST MEDICAL HISTORY: Diabetes insulin-requiring, seizure disorder, bipolar disorder not otherwise specified, depression not otherwise specified, history of cocaine abuse, gastroesophageal reflux disease, COPD, asthma, hyperlipidemia, remote history of PE, currently off  anticoagulation.   SOCIAL HISTORY: Every day smoker, denies occasional alcohol usage, positive for marijuana usage, remote history of cocaine use.   FAMILY HISTORY: Positive for lung cancer.   ALLERGIES:  SULFA DRUGS.   HOME MEDICATIONS: Include aspirin 81 mg daily, BC powder 845/65 mg p.o. q. 6 hours as needed for pain, Epitol 200 mg 1 tablet in the morning and 2 tablets in the evening, Keppra 500 mg p.o. b.i.d., Klonopin 1 mg p.o. 4 times daily, Lyrica 75 mg p.o. b.i.d., Lantus 40 units subcutaneous at bedtime, metformin 850 mg p.o. 3 times daily, glitazone 45 mg p.o. daily, Lipitor 80 mg p.o. at bedtime, Saphris 5 mg sublingual tablet 3 tablets at bedtime, temazepam 30 mg p.o. at bedtime, albuterol 90 mcg inhalation 2 puffs 4 times a day as needed for shortness of breath, Symbicort 160/4.5 mcg inhalation 2 puffs b.i.d., Norvasc 5 mg p.o. daily, hydrochlorothiazide 25 mg p.o. daily, iron 50 mg p.o. daily, Zanaflex 6 mg p.o. q. 8 hours as needed for muscle spasm, pantoprazole 20 mg p.o. b.i.d.   PHYSICAL EXAMINATION:  VITAL SIGNS: Temperature 98.1, heart rate 85, respirations 20, blood pressure 159/80, saturating 98% on supplemental O2. Weight 101.2 kg, BMI of 40.8.  GENERAL: Well-nourished, well-developed Caucasian female currently in no acute distress.  HEAD: Normocephalic, atraumatic.  EYES: Pupils equal, round, and reactive to light. Extraocular muscles intact. No scleral icterus.  MOUTH: Dry mucosal membrane. Dentition intact. No abscess noted.  EARS, NOSE, AND THROAT: Throat clear without exudates. No external lesions.  NECK: Supple. No thyromegaly. No nodules. No JVD.  PULMONARY: Clear to auscultation bilaterally without wheezes, rales, or rhonchi. No use of accessory muscles. Good respiratory effort.  CHEST: Nontender to palpation.  CARDIOVASCULAR: S1, S2, regular rate and rhythm. No murmurs, rubs, or gallops. No edema. Pedal pulses 2+ bilaterally.  GASTROINTESTINAL: Soft, nontender,  nondistended. No masses. Positive bowel sounds. No hepatosplenomegaly.  MUSCULOSKELETAL: No swelling, clubbing, edema. Range of motion full in all extremities.  NEUROLOGIC: Cranial nerves II-XII intact. No gross focal neurological deficits. Sensation intact. Reflexes intact.  SKIN: No ulcerations, lesions, rash, cyanosis. Skin warm and dry. Turgor intact.  PSYCHIATRIC: Mood and affect within normal limits. The patient is awake, alert, oriented x 3. Insight and judgment intact.   LABORATORY DATA: Sodium 137, potassium 3.3, chloride 105, bicarbonate 26, BUN 12, creatinine 0.8, glucose 129. LFTs: Total protein 8.3, albumin 3.3, bilirubin 0.2, alkaline phosphatase 124, AST 27, ALT 26. WBC is 17.2, hemoglobin 15.5 , platelets 413,000. Urinalysis is negative for evidence of infection.   IMAGING DATA:  Chest x-ray: No acute cardiopulmonary process. CT abdomen and pelvis: No acute abnormalities.   ASSESSMENT AND PLAN:  1. A 49 year old Caucasian female with history of diabetes, gastroesophageal reflux disease, COPD presenting with nausea and vomiting _____ by respiratory rate and leukocytosis. Supportive care, IV fluid hydration, and Zofran as needed.  2. Intractable nausea and vomiting. IV fluid hydration with normal saline and Zofran as needed. Clear liquids for diet. Advance as tolerated.  3. Leukocytosis. No evidence of bacterial infection at this time. No indication for antibiotics however has received them in the Emergency Department.  4. Diabetes. We will decrease her Lantus dose in half while poor p.o. intake. Increase back to normal as her diet improves.  5. Venous thromboembolism  prophylaxis. Heparin subcutaneously.   CODE STATUS:  Full code.  TIME SPENT:  45 minutes.   ____________________________ Aaron Mose. Hower, MD dkh:dd D: 10/14/2013 23:32:00 ET T: 10/15/2013 00:05:25 ET JOB#: [id number]  cc: Aaron Mose. Hower, MD, <Dictator> DAVID Woodfin Ganja MD ELECTRONICALLY SIGNED 10/22/2013 20:35

## 2014-10-24 NOTE — Consult Note (Signed)
PATIENT NAME:  Kelsey Sanchez, Kelsey Sanchez MR#:  [id number] DATE OF BIRTH:  [birth year]  DATE OF CONSULTATION:  01/03/2014  REFERRING PHYSICIAN:  Dr. Margaretmary Eddy. CONSULTING PHYSICIAN:  Corey Skains, MD  REASON FOR CONSULTATION: Elevated troponin, diabetes, chronic obstructive pulmonary disease, hypertension, and anemia with hypoxia.   CHIEF COMPLAINT: "I had back pain."   HISTORY OF PRESENT ILLNESS: A 49 year old female with known chronic obstructive pulmonary disease, hypertension, diabetes, chronic kidney disease with acute onset of significant back pain for which he took multiple medications and was clearly obtunded.  With that obtundation, the patient had the hypoxia and rhabdomyolysis with renal failure slowly improving with hydration. The patient also is anemic with hemoglobin of 10.9.  Additionally, the patient has had an elevation of troponin of 0.29, most consistent with current illness, hypoxia and demand ischemia, rather than acute coronary syndrome. The patient now is waking up from multiple medication management of her back pain.   REVIEW OF SYSTEMS: The remainder review of systems negative for vision change, ringing in the ears, hearing loss, cough, congestion, heartburn, nausea, vomiting, diarrhea, bloody stools, stomach pain, extremity pain, leg weakness, cramping of the buttocks, known blood clots, headaches, nosebleeds, congestion, trouble swallowing, frequent urination, urination at night, muscle weakness, skin lesions or skin rashes.   PAST MEDICAL HISTORY:  1.  Chronic obstructive pulmonary disease.  2.  Hypertension.  3.  Diabetes.  4.  Anemia.   FAMILY HISTORY: There is early onset of cardiovascular disease in multiple patients.   SOCIAL HISTORY:  The patient does have some tobacco use in the past.   PHYSICAL EXAMINATION:  VITAL SIGNS: Blood pressure is 122/68 bilaterally. Heart rate 72 upright, reclining, and regular.  GENERAL: She is a well-appearing female in no acute  distress.  HEENT: No icterus, thyromegaly, ulcers, hemorrhage, or xanthelasma.  CARDIOVASCULAR: Regular rate and rhythm. Normal S1 and S2.  Diffuse PMI. Carotid upstroke cannot be felt or seen. Jugular venous pressure non-existent.   LUNGS:  Expiratory wheezes and rhonchi.  ABDOMEN: Soft, nontender. Cannot assess hepatosplenomegaly or masses due to increased abdominal girth.  EXTREMITIES: Two plus radial, trace femoral and dorsal pedal pulses with 1+ lower extremity edema. No cyanosis, clubbing or ulcers.  NEUROLOGIC: She is oriented to time, place, and person, with normal mood and affect.   ASSESSMENT: This is a 49 year old female with back pain, chronic obstructive pulmonary disease, hypertension, hyperlipidemia, diabetes, anemia with acute elevation of troponin, most consistent with using narcotics and hypoxia with rhabdomyolysis, now improving.   RECOMMENDATIONS:  1.  Hydration for rhabdomyolysis.  2.  No further investigation of elevation of troponin most consistent with hypoxia and current illness.  3.  Continue diabetes control with a goal hemoglobin A1c below 7.  4.  Begin ambulation, following for any other significant symptoms and follow for treatment of anemia.  5.  Further evaluation and change in medication management for chronic back pain to reduce the possibility of overdoses.   ____________________________ Corey Skains, MD bjk:ts D: 01/03/2014 09:54:45 ET T: 01/03/2014 11:24:58 ET JOB#: [id number]  cc: Corey Skains, MD, <Dictator> Corey Skains MD ELECTRONICALLY SIGNED 01/05/2014 13:22

## 2014-10-24 NOTE — Discharge Summary (Signed)
PATIENT NAME:  Kelsey Sanchez, SCHREURS MR#:  [id number] DATE OF BIRTH:  12/08/1965  DATE OF ADMISSION:  01/02/2014 DATE OF DISCHARGE:  01/04/2014  DATE OF SIGNING OUT AGAINST MEDICAL ADVICE: 01/04/2014.   FINAL DIAGNOSES:  1. Severe rhabdomyolysis with acute renal failure.  2. Acute encephalopathy.  3. Elevated troponin.  4. Diabetes.  5. Fibromyalgia.  6. Hypotension in the Emergency Room.  7. Chronic back pain.  8. Anxiety.  MEDICATIONS: Since the patient signed out against medical advise no medications were prescribed by me, which she takes at home includes: Albuterol CFC 2 puffs 4 times a day, Ambien 5 mg at bedtime, Ativan 2 mg 3 times a day, BC Powder, glimepiride 4 mg daily, hydrochlorothiazide 25 mg daily, Lantus 50 units subcutaneous injection twice a day, Symbicort 160/4.5, two puffs twice a day, Zanaflex 6 mg 3 times a day. She states at that she also takes Lipitor. I advised her not to take the Lipitor and she probably does not need as much Lantus also.   HOSPITAL COURSE: The patient was admitted 07/03 to the critical care unit with a blood pressure in the 60s/40s, urine toxicology positive for benzodiazepines, cannabis, and opiates. She was given Narcan, unimproved. She was put on a Narcan drip and brought to the ICU, given vigorous IV fluid hydration for hypotension, acute renal failure, and rhabdomyolysis. The patient also had acute respiratory failure on presentation.   LABORATORY AND RADIOLOGICAL DATA: Included a normal sinus rhythm, no acute ST-T wave changes.   Glucose 412. Urine toxicology positive for opiates, cannabis, and benzodiazepine. Ethanol level less than 3. Urine culture negative. Urinalysis negative. Troponin 0.28. White blood cell count 19.6, H and H 11.7 and 35.8, platelet count of 299,000. Glucose 391, BUN 29, creatinine 3.42, sodium 130, potassium 4.8, chloride 95, CO2 of 27, calcium 7.6. Liver function tests: AST slightly elevated at 104. Albumin low at 2.3.    CHEST X-RAY: No active disease. ABG showed a pH of 7.34, pCO2 of 52, pO2 of 109, bicarbonate 28.1, that was on 28% oxygen. O2 saturation 97.7. CT scan of the head: No evidence of acute intracranial abnormality.   CPK 8958. Blood cultures negative.   Ultrasound of the kidneys: Negative.   CPK went up to 9916. Next creatinine 1.46 on 07/04, creatinine normalized at 0.78 on the 5th, CPK down to 6757.   Thoracic spine x-ray negative.   HOSPITAL COURSE PER PROBLEM LIST:  1. For the patient's severe rhabdomyolysis, acute renal failure: The patient's acute renal failure had improved with IV fluid hydration. The patient was put on bicarbonate by Dr. Juleen China. This was switched over to normal saline on 07/05 since the CPK was still elevated at 6757 and I advised the patient IV fluid hydration and serial CPKs. The patient wanted to go home. I advised her that she should stay for IV fluid hydration. The patient signed out against medical advice. The patient was cleared the day before by psychiatry that she is not suicidal so she was allowed to sign out against medical advice. I advised her not to take her Lipitor, which could be a cause of rhabdomyolysis. 2. Acute encephalopathy, this had improved, likely from polysubstance abuse as outpatient. I did not give her Dilaudid here in the hospital.  3. Elevated troponin, likely secondary to rhabdomyolysis. This is not a myocardial infarction.  4. Diabetes. I had her on sliding scale here. Sugars in the 200s. I do not think she will need her dose of Lantus at  home.  5. Fibromyalgia. Pain medications do not improve this.  6. Hypotension in the Emergency Room. This had resolved with fluid resuscitation.  7. Chronic back pain. Percocet was given while here. I did not prescribe Dilaudid secondary to the acute respiratory failure that she had on presentation. Upon discharge her pulse oximetry was 94% on room air. Respiratory failure had improved. 8. Anxiety. That  dose of Ativan is not a good dose for this patient. Must be decreased as outpatient.  PRIMARY CARE PHYSICIAN: Dr. Enid Derry.  Again, patient signed out against medical advice.    ____________________________ Tana Conch. Leslye Peer, MD rjw:lt D: 01/04/2014 14:03:25 ET T: 01/05/2014 00:14:27 ET JOB#: [id number]  cc: Tana Conch. Leslye Peer, MD, <Dictator> Enid Derry, MD Marisue Brooklyn MD ELECTRONICALLY SIGNED 01/05/2014 16:24

## 2014-10-24 NOTE — H&P (Signed)
PATIENT NAME:  Kelsey Sanchez, Kelsey Sanchez MR#:  [id number] DATE OF BIRTH:  06-Mar-1966  DATE OF ADMISSION:  10/14/2013  ADDENDUM   Updated home medication list includes BC Powder 845/65 mg by mouth every six hours as needed for pain, Ativan 2 mg by mouth 3 times daily, Lantus 50 units subcutaneous twice daily, Ambien 5 mg by mouth at bedtime, albuterol 90 mcg inhalation 2 puffs 4 times daily as needed for shortness of breath, Symbicort 160/4.5 mcg inhalation 2 puffs twice daily, Norvasc 5 mg by mouth daily, hydrochlorothiazide 25 mg by mouth daily.    ____________________________ Aaron Mose. Zilda No, MD dkh:ea D: 10/15/2013 00:08:31 ET T: 10/15/2013 00:21:05 ET JOB#: [id number]  cc: Aaron Mose. Riyanshi Wahab, MD, <Dictator> Khyree Carillo Woodfin Ganja MD ELECTRONICALLY SIGNED 10/22/2013 20:35

## 2014-10-24 NOTE — Discharge Summary (Signed)
PATIENT NAME:  Kelsey Sanchez, Kelsey Sanchez MR#:  [id number] DATE OF BIRTH:  January 22, 1966  DATE OF ADMISSION:  10/14/2013 DATE OF DISCHARGE:  10/16/2013  For a detailed note, please take a look at the history and physical done on admission by Dr. Valentino Nose.   DIAGNOSES AT DISCHARGE: Is as follows:  1. Systemic inflammatory response syndrome secondary to a viral gastrointestinal illness. 2. Intractable nausea, vomiting due to a viral gastrointestinal illness, now resolved. 3. Chronic obstructive pulmonary disease, stable.  4. Diabetes.  5. Hypertension.   DIET:  The patient is being discharged on a low-sodium, low-fat, American Diabetic Association diet.   ACTIVITY: As tolerated.   FOLLOWUP:  Dr. Enid Derry in the next 1-2 weeks.   DISCHARGE MEDICATIONS: Albuterol inhaler 2 puffs 4 times daily as needed, Symbicort 160/4.5 two puffs b.i.d., amlodipine 5 mg daily, BC powder q. 6 hours as needed, hydrochlorothiazide 25 mg daily, Lantus 50 units b.i.d., Ambien 5 mg at bedtime, Ativan 2 mg t.i.d.   PERTINENT STUDIES DONE DURING THE HOSPITAL COURSE: Are as follows: A CT scan of the abdomen and pelvis done with contrast showing no acute abnormality in the abdomen, hepatic steatosis. An x-ray of the chest done showing no acute cardiopulmonary disease. The patient's blood culture is essentially negative.   HOSPITAL COURSE: This is a 49 year old female with medical problems as mentioned above, presented to the hospital on 10/14/2013 due to abdominal pain and intractable nausea, vomiting, and tachycardia.  1. Systemic inflammatory response syndrome. The patient presented with tachycardia, leukocytosis and nausea, vomiting. This was likely secondary to a viral GI illness. The patient was treated supportively with IV fluids, antiemetics, and antipyretics. The patient, after getting supportive care, has significantly improved. She has remained afebrile now. Her white cell count has normalized. She is no longer  tachycardic. She is tolerating p.o. well. Her nausea, vomiting, diarrhea has improved and, therefore, she is being discharged home.  2. Intractable nausea or vomiting. This was likely secondary to a GI viral illness. Her CT abdomen and pelvis on admission showed no acute pathology. She was treated supportively with antiemetics, IV fluids, and pain control. Her diet was slowly advanced from a clear liquid eventually to a regular diet, which she is tolerating well without any further exacerbation of her nausea, vomiting, or abdominal pain. She is therefore being discharged home.  3. Diabetes. While in the hospital, the patient was maintained on some sliding scale insulin and Levemir, although she will resume her Lantus upon discharge. 4. Hypertension. The patient was maintained on her Norvasc and she will resume that.  5. Chronic obstructive pulmonary disease. The patient had no evidence of acute chronic obstructive pulmonary disease exacerbation. She was maintained on her Symbicort. She will resume that along with albuterol inhaler as needed.   CODE STATUS: The patient is a full code.   TIME SPENT ON DISCHARGE: 35 minutes.    ____________________________ Belia Heman. Verdell Carmine, MD vjs:dd D: 10/16/2013 15:52:34 ET T: 10/16/2013 23:24:50 ET JOB#: [id number]  cc: Belia Heman. Verdell Carmine, MD, <Dictator> Enid Derry, MD Henreitta Leber MD ELECTRONICALLY SIGNED 10/27/2013 10:43

## 2014-10-24 NOTE — Consult Note (Signed)
PATIENT NAME:  Kelsey Sanchez, ERPELDING MR#:  [id number] DATE OF BIRTH:  02-23-1966  DATE OF CONSULTATION:  01/03/2014  REFERRING PHYSICIAN:   CONSULTING PHYSICIAN:  Ari Bernabei K. Machel Violante, MD  AGE: 49 years.  SEX:  Female  RACE:   White.  SUBJECTIVE:  Patient was seen in consultation in room #150 at [facility], [place], [place].  The patient is a 49 year old white female who is married and husband is 41 years old and has 3 sons and 2 of them live with patient and her husband.  All 4 of them live in a house.  The patient comes for inpatient hospitalization to ICU after having overdosed with multiple substances, opiates in particular.  Patient denies these statements and she reports that she took 1 pain pill and then she took a muscle relaxant and she did it because she was in pain as she has been struggling with pain.  These medications were absolutely prescribed to her.  She did not mean to hurt herself.  She was just lying down and her son came from work and called EMS for her.    PAST PSYCHIATRIC HISTORY:  A long history of bipolar disorder with rapid cycling.  She had several inpatient hospitalizations on psychiatry, was inpatient at [facility] about 4 or 5 times for depression and drug addiction-cocaine.  History of inpatient at other facilities on a few occasions.  History of suicide attempt 3 times.  All other times, she overdosed on pills.  On 1 occasion, she took an overdose of 350 pills, and really it is a prescription medication.  Being followed by Dr. Kasandra Knudsen in [place], [place].  Last appointment 2 months ago.  Next appointment coming up on January 09, 2014.    ALCOHOL AND DRUGS: Denies drinking alcohol.  Does admit smoking THC at a rate of 2 joints per day, about 3 days a week since she was 49 years old.  Last smoked a joint 12/31/2013.  Does admit history of crack cocaine abuse for many years and have been sober for 3 years.  Does admit smoking nicotine cigarettes at a rate of half pack a day for  many years.    MENTAL STATUS:  Patient is alert and oriented to place, person and time.  Stated that she took a pain pill and muscle relaxant because her pain was not under control and she did to mean to hurt herself and she was brought to the hospital after her son found her.  Denies feeling depressed.  Denies feeling hopeless or helpless.   .  Denies any reason to plan to hurt herself or others.  She reports that she is doing fine and she did not have any intentions of hurting self.    No psychosis, inner thoughts, visual hallucinations, auditory hallucinations, paranoid or suspicious ideas.   Cognition intact.  General information is fair.  Memory and recall are good.  Able to dress well and navigation is good.  Insight and judgment fair.  Contracts for safety and is eager to go home and keep up with all of her appointments with Dr. Kasandra Knudsen on January 09, 2014.    IMPRESSION:    Bipolar disorder, active cycling.  Feel as if she is sort of depressed. Crack cocaine dependency/abuse, in remission for 3 years.  She has been independent for many years.  Nicotine dependent.  Recommend discontinue one on one observation as patient is not suicidal, contracts for safety.  Discharge patient when she is medically cleared and stable  as she is eager to keep up her follow up with Dr. Kasandra Knudsen in [facility] on January 09, 2014.   Contract for safety.     ____________________________ Wallace Cullens. Franchot Mimes, MD skc:ts D: 01/03/2014 17:14:52 ET T: 01/04/2014 00:07:26 ET JOB#: [id number]  cc: Arlyn Leak K. Franchot Mimes, MD, <Dictator> Dewain Penning MD ELECTRONICALLY SIGNED 01/04/2014 17:56
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
## 2014-10-25 NOTE — Consult Note (Signed)
Patient seen at request of Dr Weber Cooks to evaluate for treatment post this present treatment. She was able to self report that she had a suicide attempt related to her abuse of cocaine. She was also able to express events that led to relapse as well as her attempt at suicide. She denied having any current thoughts of suicide or wishes to die. She reported that she has spoken with her husband and that plans are for him to maintain their marriage. She expressed overall desires of lifetime sober living with plans of staying in treatment to help maintain sobriety. We explored plans to re-enter IOP after physical stabilization as well as to obtain a private therapist to assist in her treatment.   Plan: Return to [facility]- IOP at this inpatient treatment completion as well as obtain private therapist. Patient / Care Mgr to telephone and establish assessment appointment. Assigned Care Mgr informed of patient's plans of treatment thru the services of the [facility]. [facility]IOP Intern Cottie Banda present.    Electronic Signatures: Laqueta Due (PsyD)  (Signed on 15-Jan-13 19:29)  Authored  Last Updated: 15-Jan-13 19:29 by Laqueta Due ([place])

## 2014-10-25 NOTE — Consult Note (Signed)
PATIENT NAME:  Kelsey Sanchez, LAMA MR#:  [id number] DATE OF BIRTH:  [birth year]  DATE OF CONSULTATION:  07/26/2011  REFERRING PHYSICIAN:  Dr. Noemi Chapel  CONSULTING PHYSICIAN:  Drue Stager. [place], MD  REASON FOR CONSULTATION: Depressed mood, suicidal ideation, cocaine dependence.   HISTORY OF PRESENT ILLNESS: Kelsey Sanchez is a 49 year old female who developed a pattern of cocaine dependence five years ago. She has developed progressive shame as she as continued to relapse. She reports that her significant other is done with her (her husband). She does report that he is likely uneducated regarding dependence and the tools needed to overcome it.   She has been living at home with him. She began experiencing severe depressed mood approximately six weeks ago including anhedonia, poor concentration, poor appetite, hopelessness and suicidal ideation. She had been in the hospital one week ago after an overdose of insulin. She has developed the perception that her life has been made a shambles with her cocaine addiction. This includes the severe disruption of her relationship with her husband.   She also has been off of her prescription psychotropic medications. She states that when she takes them regularly and does not relapse she does well with stable mood   Other stresses involve fibromyalgia pain. Please see the medications below. She acknowledges her powerlessness over her addiction and is motivated to re-engage the twelve-step method. She has engaged in that method before and has obtained six months of abstinence.   Her mood symptoms do not change in intensity due to any factors including the change between nighttime and daytime.   PAST PSYCHIATRIC HISTORY: The patient provides history and her past medical record is reviewed specifically her psychiatric record at [facility]. In the fall of 2010 she presented with an exacerbation of her bipolar disorder, specifically a depressive  phase. She was discharged on Zoloft 200 mg daily, trazodone 200 mg at bedtime, Klonopin 1 mg b.i.d. p.r.n., Depakote ER 500 mg b.i.d., Zoloft 100 mg daily.   She does have a history of multiple suicide attempts that have involved overdoses. She describes periods lasting as long as three weeks in her 30s that would involve elevated mood, racing thoughts, increased energy, and decreased need for sleep. 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   In February 2011 Ms. Pickerill was diagnosed with bipolar disorder rapid cycling in a depressed phase. She required admission. She was discharged on Klonopin 1 mg b.i.d., Depakote ER 1000 mg at bedtime, lithium 300 mg b.i.d., Restoril 15 mg p.r.n. insomnia.   She was admitted to the inpatient Behavioral Medicine unit at [facility] in April of 2011. At that time she was experiencing a depressed phase of bipolar disorder which was accompanied by cocaine abuse and marijuana abuse. She was discharged on lithium 300 mg b.i.d. and Restoril 15 mg at bedtime.   She reports that she has never attended a residential chemical dependence rehabilitation program.   She feels much shame about this current cocaine relapse, noncompliance with her psychotropic medication, however, her suicidal ideation has resolved and she contracts for safety. She agrees to contact emergency services immediately for any thoughts of harming herself, thoughts of harming others or distress. She is highly motivated for chemical dependence rehabilitation. She is familiar with the twelve-step program.   FAMILY PSYCHIATRIC HISTORY: Unknown.   SOCIAL HISTORY: Kelsey Sanchez lives with her husband at home. There is no abuse, however, she states that she has  been worried that he is worn out with her substance abuse pattern, however, she does point out that she has not been engaged in a residential rehabilitation facility. She also reports that he has not received  education regarding the psychosocial components in addiction such as codependency. He also has not learned about addiction as an illness.   OCCUPATION: Unemployed. She does not use any alcohol.   PAST MEDICAL HISTORY:  1. Obesity.   2. Diabetes. 3. Seizure disorder. 4. Anemia. 5. Chronic obstructive pulmonary disease.  6. Hypertension.  7. Gastroesophageal reflux disease.   MEDICATIONS ON ADMISSION:  1. Albuterol 90 mcg 2 puffs inhaled 4 times a day p.r.n.  2. Zanaflex 6 mg q.8 hours as needed. 3. Symbicort 160 mcg/4.5 mcg 2 puffs b.i.d.  4. Ambien 5 mg one oral at bedtime p.r.n. insomnia. 5. Lipitor 80 mg daily. 6. Klonopin 1 mg t.i.d. as needed for anxiety. This has been tapered in the Emergency Room. 7. Lantus 20 units sub-Q daily. 8. Lyrica 75 mg 1 b.i.d.  9. Coumadin 5 mg 1 daily.  10. Keppra 500 mg 1 b.i.d.  11. Pantoprazole 40 mg b.i.d.  12. Viibryd 40 mg daily.  13. Abilify 10 mg daily. 14. Amlodipine 10 mg daily.   NOTE: Kelsey Sanchez reiterates that her mood has been most stabilized on the Abilify combined with the Viibryd as long as she can stay away from substance relapse.   ALLERGIES: Sulfa drugs.   LABORATORY, DIAGNOSTIC AND RADIOLOGICAL DATA: Pregnancy test negative. Cocaine positive on urinalysis. WBC 16.8. Hemoglobin normal. Platelet count mildly elevated. Ethanol negative. Glucose 220, BUN slightly decreased at 6, prothrombin 22.6.   REVIEW OF SYSTEMS: Constitutional, head, eyes, ears, nose, throat, mouth, neurologic, psychiatric, cardiovascular, respiratory, gastrointestinal, genitourinary, skin, musculoskeletal, hematologic, lymphatic, endocrine, metabolic all unremarkable.   PHYSICAL EXAMINATION:  GENERAL: Kelsey Sanchez is a well developed, middle-aged female sitting up in her hospital chair with no abnormal involuntary movements. Her muscle tone is normal. She does have normal gait. Her muscle tone is normal. Strength is grossly normal. She has normal body  habitus. No deformities. Grooming and hygiene are normal.   VITAL SIGNS: Temperature 96.9, pulse 99, respiratory rate 18, blood pressure 103/49.   MENTAL STATUS EXAM: Ms. Marczak is alert. Her concentration is mildly decreased. Her attention span is mildly decreased. Eye contact is good. She is oriented to all spheres. Her memory is intact to immediate, recent, and remote. Her speech involves a normal rate, volume, and articulation as well as normal prosody. Fund of knowledge, use of language, and intelligence are normal. Mood mildly depressed. Affect mildly constricted. Thought process normal rate, logical, coherent, goal directed. No looseness of associations or tangents. Abstraction is intact. Thought content: No thoughts of harming herself, no thoughts of harming others. No delusions or hallucinations. Her shame of relapse has responded to ego supportive psychotherapy. She contracts for no harm in the hospital and agrees to notify the nursing staff for any thoughts of harming herself or thoughts of harming others. She has no delusions or hallucinations.   Affect mildly constricted. Mood mildly depressed. Insight is intact. Judgment is intact.   ASSESSMENT:  AXIS I:  1. Bipolar I disorder, depressed.  2. Cocaine dependence.   AXIS II: Deferred.   AXIS III: Obesity. Please see past medical history.   AXIS IV: Primary support group.   AXIS V: 55.   Ms. Recendiz is no long at risk to harm herself or others. She agrees to call staff or  call 911 for any thoughts of harming herself, thoughts of harming others, or distress.   The indications, alternatives and adverse effects of the following medications were discussed with Ms. Sonoda: Abilify to augment her Viibryd as her primary mood stabilizer and augmenting for antidepression, Viibryd as her primary antidepressant agent including the risk of the Abilify causing a nonreversible movement disorder, elevated glucose and metabolic syndrome. The patient  understands and wants to proceed with the medications as discussed.   Therefore, the undersigned will order the Abilify 10 mg at bedtime and request a nonformulary dosage of Viibryd 40 mg daily.   No sitter is required.   She is awaiting transfer to a residential chemical dependency rehabilitation program. She is motivated and from a 12-step perspective she is motivated for her own internal goal of serenity independent of her significant other's reaction.   In these type of situations the significant other can be facilitated into entering Al-Anon. She understands, however, that this does not guarantee and the she must pursue this for her own ego strength.   DIET: Regular.   ACTIVITY: Routine.   ____________________________ Drue Stager. [place] Ladson, MD jsw:cms D: 07/26/2011 22:59:06 ET T: 07/27/2011 10:17:04 ET JOB#: [id number]  cc: Drue Stager. Tyanne Derocher, MD, <Dictator> Billie Ruddy MD ELECTRONICALLY SIGNED 07/28/2011 19:29

## 2014-10-25 NOTE — H&P (Signed)
PATIENT NAME:  Kelsey Sanchez, Kelsey Sanchez MR#:  [id number] DATE OF BIRTH:  April 22, 1966  DATE OF ADMISSION:  07/14/2011  REFERRING PHYSICIAN: Dr. Jasmine December  PRIMARY CARE PHYSICIAN: Dr. Lovie Macadamia  PSYCHIATRIST: Has none currently.   CHIEF COMPLAINT: Suicidal attempt drug overdose.   HISTORY OF PRESENT ILLNESS: Patient is a 49 year old obese female with history of insulin-dependent diabetes, bipolar, depression, seizure disorder, and asthma who presents after intentional overdose with Lantus 600 units. Patient stated that yesterday she relapsed on cocaine after six months. Today she had a fight with her husband and family and became suicidal. She wanted to hurt herself and took the insulin intentionally. This happened around 9:00. She took two pins of insulin each consisting of 300 units each. Patient denies having any other ingestions or overdose at the same time. Here her initial serum glucose was 82 at 10:00. Poison Control was called. She denies having overdose of her p.o. diabetes or any other medications. She tested positive on urine toxicology for cocaine and her serum acetaminophen level was less than 2. Salicylate was slightly elevated at 5. Patient has been placed on D5. Patient continues to be depressed, crying and states she wants to just not be in this world anymore. Patient denies having any blurry vision, however, feels some overall lethargy. She is awake, alert, oriented x3. She is also eating some crackers and juice. Hospitalist services were contacted for further evaluation and admission. Psychiatry has also been consulted, however, they have not seen the patient as of yet. The last blood sugar was in the low 100s about 20 to 30 minutes ago.   PAST MEDICAL HISTORY:  1. History of PE. 2. Gastroesophageal reflux disease. 3. Asthma. 4. Depression. 5. Bipolar.  6. Fibromyalgia.  7. Diabetes with neuropathy. 8. Seizure disorder. 9. Previous drug abuse.   PAST SURGICAL HISTORY:  1. Tubal  ligation.  2. Right knee surgery. 3. Carpal tunnel syndrome.   ALLERGIES: Sulfa.   FAMILY HISTORY: Father with lung cancer.   SOCIAL HISTORY: Lives with her family. Smokes 1/2 pack a day. Denies any alcohol use. Does have history of substance abuse and cocaine use, last she smoked it yesterday.   MEDICATIONS: Medications as of last discharge.  1. Amaryl 4 mg daily.  2. Ferrous sulfate 325 mg t.i.d.  3. Metformin 850 mg t.i.d.  4. Lantus 40 units at bedtime. 5. Coumadin 5 mg daily.  6. Klonopin 1 mg t.i.d.  7. Viibryd 40 mg daily. 8. Abilify 10 mg daily.  9. Tizanidine 6 mg t.i.d.  10. Ambien as needed. 11. Albuterol nebulizer every 4 to 6 hours. 12. Symbicort 2 puffs b.i.d.  13. Keppra 500 mg twice daily.  14. Protonix 40 mg b.i.d.  15. Lipitor 80 mg daily. 16. Enalapril 20 mg b.i.d.   REVIEW OF SYSTEMS: CONSTITUTIONAL: Denies any fever. She feels some weakness and lethargy. No pain. No weight changes. EYES: No blurry vision or double vision. No redness or glaucoma. ENT: No tinnitus, ear pain, or hearing loss. No discharge. RESPIRATORY: Denies having any wheezing or shortness of breath. She has a nonproductive cough which is chronic. She has history of chronic obstructive pulmonary disease. CARDIOVASCULAR: Denies having any chest pain, orthopnea, or edema. History of hypertension. No syncope or palpitations. GASTROINTESTINAL: No nausea, vomiting, or diarrhea, or abdominal pain. No constipation or bloody stools. GENITOURINARY: Denies dysuria, hematuria. ENDOCRINE: No polyuria or nocturia. No increased sweating. HEME/LYMPH: No anemia or easy bruising. SKIN: Denies any rashes. MUSCULOSKELETAL: Chronic arthritis. NEUROLOGIC: She has neuropathy.  Feels general weakness. No focal weakness. No dementia. PSYCH: Has history of anxiety, insomnia, bipolar and depression.   PHYSICAL EXAMINATION:  VITAL SIGNS: Temperature 98.7, pulse rate 100, respiratory rate 22, blood pressure 104/56, oxygen  saturation 98% on oxygen.   GENERAL: Patient is obese, Caucasian female laying in bed in mild distress, crying.   HEENT: Normocephalic, atraumatic. Pupils are equal and reactive. No JVD. Anicteric sclerae. Moist mucous membranes.   NECK: Supple. No thyroid tenderness.   CARDIOVASCULAR: S1, S2 regular rate and rhythm. No murmurs, rubs, or gallops appreciated.   LUNGS: Decreased breath sounds, mild wheezing in both lung fields.   ABDOMEN: Soft, nontender, nondistended. Positive bowel sounds all fields. No hepatomegaly noted.   EXTREMITIES: No significant edema.   SKIN: No significant rashes.   NEUROLOGIC: Cranial nerves II through XII grossly intact. Strength 5/5 all extremities. Sensation intact to light touch.   PSYCH: Anxious, tearful, appears very depressed.   LABORATORY, DIAGNOSTIC AND RADIOLOGICAL DATA: Glucose on the BMP on arrival 82, BUN 5, creatinine 0.63, sodium 143, potassium 3.3, chloride 107, CO2 23, calcium 8.2, alcohol level less than 0.003%, albumin 3.1, otherwise LFTs within normal limits. TSH 1.58. Urine toxicology positive for cocaine. WBC slightly elevated at 13.3, hemoglobin 13.8, hematocrit 42.6. Platelets 353. INR 1.1. Urinalysis 2347 RBCs, 27 WBCs, trace bacteria. Salicylate level 5. Acetaminophen level 2. Urine pregnancy test is negative. EKG: Sinus rate 97. No increase in PR, QRS or QT durations. No acute ST elevations or depressions.   ASSESSMENT AND PLAN: We have a 49 year old with depression, anxiety, bipolar and substance abuse, insulin-dependent diabetes who had:  1. Intentional overdose of 600 units of Lantus in attempt at suicide in the setting of relapsing on cocaine yesterday. At this point, patient has been on D5 although she feels some lethargy and weakness is awake, conversant, and oriented. She is also eating. At this point would continue the D5. Poison Control have been contacted by me and the ER staff and the recommendations are to continue the D5 for  now and frequent blood glucose checks. Would admit the patient to telemetry and monitor blood sugars every one hour and start a sitter for her. We would check BNP and potassium every eight hours and replace as needed. Patient has a significant risk of hypoglycemia and possible coma and death given the extremely high level of Lantus she took.  2. In regards to her suicidal attempt, I discussed the case with Dr. Weber Cooks from psychiatry who will see her shortly. Again we would start a sitter and I will continue Abilify for now and would await for further recommendations from psych. Patient would need admission to psychiatry after her blood sugars have been deemed stable and she is over the timeframe for having symptoms from her insulin toxicity. Would also check a Keppra level and check a repeat acetaminophen level in a few hours to make sure that she also did not take acetaminophen. Her urine toxicology is negative for benzodiazepines as well as opiates.  3. In regards to her seizure history we would resume the Keppra and check levels. Would hold all of her diabetes medicine. Encourage liberal fluids and p.o. intake. I would hold her blood pressure medications to prevent possible hypotension and hemodynamic instability from possible hypoglycemia.  4. She still does smoke tobacco she was counseled for three minutes.  5. She does have history of PE. Although she is on Coumadin her INR is subtherapeutic. She was recently discharged here last month and her  INR was close to therapeutic at that time and we would continue the Coumadin and start therapeutic doses of Lovenox.  6. She does have some wheezing on auscultation over the lungs. She did smoke cocaine and also does smoke. She has history of asthma and chronic obstructive pulmonary disease per patient and I would continue her Symbicort and start her on nebulizers. Would continue the oxygen as well.   CRITICAL CARE TIME: 60 minutes.   Case discussed with Dr.  Weber Cooks and Poison Control.     CODE STATUS: FULL CODE.   ____________________________ Vivien Presto, MD sa:cms D: 07/14/2011 13:27:50 ET T: 07/14/2011 14:03:24 ET JOB#: [id number]  cc: Vivien Presto, MD, <Dictator> Youlanda Roys. Lovie Macadamia, MD Vivien Presto MD ELECTRONICALLY SIGNED 08/01/2011 15:48

## 2014-10-25 NOTE — Discharge Summary (Signed)
PATIENT NAME:  Kelsey Sanchez, Kelsey Sanchez MR#:  [id number] DATE OF BIRTH:  [birth year]  DATE OF ADMISSION:  07/14/2011 DATE OF DISCHARGE:  07/19/2011  PRIMARY CARE PHYSICIAN: Dr. Lovie Macadamia  REASON FOR ADMISSION: Drug overdose, suicide attempt.   DISCHARGE DIAGNOSES:  1. Insulin overdose in suicide attempt.  2. Suicide attempt.  3. Bipolar disorder.  4. Cocaine abuse.  5. History of diabetes mellitus, insulin requiring.  6. History of pulmonary embolus.  7. History of seizure disorder.  8. History of gastroesophageal reflux disease.  9. History of asthma.  10. History of fibromyalgia.  11. History of depression.  12. History of diabetes and neuropathy. 13. History of drug abuse in the past.  14. Hypercholesterolemia.   DISCHARGE MEDICATIONS:  1. Coumadin 5 mg p.o. daily.  2. Iron sulfate 325 mg p.o. t.i.d.  3. Keppra 500 mg p.o. b.i.d.  4. Protonix 40 mg b.i.d.  5. Viibryd 40 mg daily. 6. Abilify 10 mg daily. 7. Amlodipine 10 mg daily. 8. Zanaflex 6 mg p.o. t.i.d. p.r.n.  9. Symbicort 160/4.5, 2 puffs inhaled b.i.d.  10. Ambien 5 mg p.o. at bedtime p.r.n. insomnia.  11. Klonopin 1 mg p.o. q.8 hours p.r.n. anxiety.  12. Tylenol 325 mg 1 to 2 tablets p.o. every 4 to 6 hours p.r.n. pain.  13. Lipitor 80 mg p.o. daily.  14. Albuterol metered dose inhaler 1 to 2 puffs inhaled every 4 to 6 hours p.r.n.  15. Lantus 20 units subcutaneous at bedtime.  DISCHARGE DISPOSITION: Home.   DISCHARGE ACTIVITY: As tolerated.   DISCHARGE DIET: Low sodium, ADA, low fat, low cholesterol.   DISCHARGE INSTRUCTIONS:  1. Take medication as prescribed.  2. Return to Emergency Department for recurrence of symptoms or for suicidal thoughts.   3. Return to Emergency Department for any bleeding complications. 4. Return to Emergency Department for any chest pain, shortness of breath or any lower extremity swelling or pain.   FOLLOW UP INSTRUCTIONS:  1. Follow up with Dr. Lovie Macadamia within 1 to 2 weeks.  Patient needs repeat PT/INR in the next 2 to 3 days with further adjustments to be made to her Coumadin dosing accordingly and she may also need further adjustment of her diabetic regimen.  2. Patient being referred to intensive outpatient psychiatry by Dr. Weber Cooks and patient to follow up in one week.  3. Follow up with the mental health center within 1 week.   CONSULTS DURING THIS HOSPITALIZATION: Psychiatry, Dr. Weber Cooks.   LABORATORY, DIAGNOSTIC AND RADIOLOGICAL DATA: Portable chest x-ray on admission: No acute cardiopulmonary abnormalities are noted.   Serum glucose 82 on admission, serum glucose 129 on the day of discharge.   Hemoglobin A1c 8.1.   Serum alcohol less than 0.003% on admission.   Urine drug screen positive for cocaine on admission.   TSH 1.58.   Hemoglobin A1c was 8.1.   INR 1.1 on admission which is subtherapeutic and 2 on the day of discharge.   Urine culture 07/14/2011: No growth to date.   Serum Keppra level from 07/14/2011 was therapeutic at 52.4.   Serum acetaminophen level less than 2 on admission.    EKG on admission: Normal sinus rhythm, 96 beats per minute with normal axis, normal intervals, no acute ST or T wave changes. Urine pregnancy test was negative from 07/14/2011.   Lipid panel: Total cholesterol 220, triglycerides 298, HDL 30, LDL 130.   BRIEF HISTORY/HOSPITAL COURSE: Patient is a 49 year old obese female with past medical history of diabetes mellitus, asthma, depression, bipolar  disorder, gastroesophageal reflux disease, history of pulmonary embolus, seizure disorder, history of substance and drug abuse who was brought to the ER for a suicide attempt after insulin overdose. Please see dictated admission history and physical for pertinent details surrounding the onset of this hospitalization. Please see below for further details.  1. Insulin overdose and suicide attempt-Patient overdosed with long-acting insulin (Lantus) and was also abusing  cocaine. Apparently she self administered 600 units of Lantus prior to admission in a suicide attempt. Serum glucose was 82 on admission. She was admitted to the medical floor and placed on dextrose infusion initially with D10 and we were watching her closely for hypoglycemia. She did have some fluctuation of her sugars while being on D10, however, sugars eventually stabilized. Once it was noted that she was not becoming hypoglycemic with D10 she was switched to D5 infusion. She did well with both. Sugars eventually stabilized. Once sugars had stabilized she started to become hyperglycemic. Thereafter D5 infusion was stopped and her sugars were monitored. She did not drop or developed any hypoglycemia and we obtained frequent Accu-Cheks. Overall, her diabetes could be better controlled. Her hemoglobin A1c is 8.1. In the hospital she was initially maintained on involuntary commitment and placed on suicide precautions and had 1:1 sitter present for majority of this hospitalization. Psychiatry consultation was obtained and patient was seen by Dr. Weber Cooks. For patient's depression associated with her bipolar disorder she was restarted on Viibryd and Abilify per psychiatry and also on some p.r.n. Ativan for anxiety and also some Ambien for insomnia per psychiatry and thereafter her moods appear to have improved. Dr. Weber Cooks felt that patient was able to verbally contract for her safety and felt that patient was no longer suicidal and did not feel that patient required admission to [facility] inpatient behavioral medical unit and recommended close outpatient follow up with mental health clinic and intensive outpatient psychotherapy to which patient agreed. Thereafter Dr. Weber Cooks recommended discontinuing involuntary commitment and also 1:1 sitter as he felt that patient was not suicidal.   2. History of pulmonary embolus-Patient came in with subtherapeutic INR and this was from medication noncompliance as she admitted to  missing some doses of Coumadin. She was maintained on Lovenox as a bridge to Coumadin and did not experience any chest pain or shortness of breath and her INR is now therapeutic on the day of discharge and Lovenox has been discontinued and she will continue Coumadin. She was strongly advised to remain compliant with her Coumadin. She will need close monitoring of her PT/INR as an outpatient.  3. Type 2 diabetes mellitus, insulin requiring-Initially Lantus was held and all insulin was held since she administered 600 units of Lantus in a suicide attempt. Once her sugars were noted to have stabilized and she started to become hyperglycemic she was restarted on her Lantus at a lower dose than her baseline. She will likely need to have her dose adjusted as an outpatient depending on her blood sugars and this will be deferred to her primary care physician to which patient was agreeable.  4. History of seizure disorder-Patient was maintained on seizure precautions and was maintained on Keppra and level was therapeutic and she will continue this as an outpatient as well. She did not experience any seizures while in the hospital.  5. Gastroesophageal reflux disease-Patient continued on proton pump inhibitor.  6. Asthma-This was stable and without acute exacerbation. She was without any shortness of breath and chest x-ray was benign and she was started  on Symbicort at the time of admission due to mild wheezing which had quickly resolved and her wheezing was felt to be due to cocaine abuse and bronchospasm. She will continue Symbicort as an outpatient.  7. On 07/19/2011 patient was hemodynamically stable and was not hypoglycemic and she was felt to be nonsuicidal per psychiatry and she was hemodynamically and medically stable and was felt to be stable for discharge home with close outpatient follow up to which patient was agreeable.  TIME SPENT ON DISCHARGE: Greater than 30 minutes.   ____________________________ Romie Jumper, MD knl:cms D: 07/22/2011 17:31:59 ET T: 07/23/2011 11:37:34 ET  JOB#: [id number] cc: Youlanda Roys. Lovie Macadamia, MD [facility] and Gonzella Lex, MD Romie Jumper MD ELECTRONICALLY SIGNED 08/01/2011 18:51

## 2014-10-25 NOTE — Consult Note (Signed)
Details:    - Psychiatry: Patient seen in follow up. Patient made a serious suicide attempt last week after relapsing on cocaine. Today she reports that her mood is better. She denies having any current thought of suicide or wish to die. She has spoken with her husband and found he is not throwing her out of the house. She had also been of her medication for about a montha nd feels better back on it. Her affect today is more calm and appropriate and her thoughts more lucid. She has insight into the importance of staying in treatment to help maintain sobriety. We discussed treatment options. We agree there is probably little benifit at this point to her going to inpatient psychiatry. She is agreeable to my suggestion to re-enter IOP if possible. and says she intends to start back in NA meetings.  Plan: No change to medication. Try to refer her to IOP. Will need follow-up appointment made to see a psychiatrist since she is no longer able to see Dr. Kasandra Knudsen since he has moved to [facility].   Electronic Signatures: Gonzella Lex (MD)  (Signed 14-Jan-13 18:28)  Authored: Details   Last Updated: 14-Jan-13 18:28 by Gonzella Lex (MD)

## 2014-10-25 NOTE — Consult Note (Signed)
PATIENT NAME:  Kelsey Sanchez, Kelsey Sanchez MR#:  [id number] DATE OF BIRTH:  1965-08-25  DATE OF CONSULTATION:  07/14/2011  CONSULTING PHYSICIAN:  Gonzella Lex, MD  IDENTIFYING INFORMATION AND REASON FOR CONSULT: This is a 49 year old woman who was brought into the Emergency Room by EMS after making a serious suicide attempt by overdose of insulin. She is being admitted to medicine. Consult to assist with psychiatric management.   HISTORY OF PRESENT ILLNESS: The patient says that yesterday she relapsed on cocaine. She was not aware that she was intending to do it but just on the spur of the moment started smoking crack. She wound up smoking up all of the family's current cash. Today she told her husband about it and they got in a big fight. The husband told her that he was sick of it and this was her last chance with him and that he wanted her out. She got very upset and went to her bedroom and shot up with all of the insulin pens that she had available. Husband found her doing it and called EMS. The patient says that she seriously wanted to kill herself. In fact, she says that even now she is still wishing that she were dead. Prior to yesterday she said her mood had been feeling pretty good. She had not been having significant problems with mood swings or irritability. She had not had any remarkable changes in her sleep or appetite. She had not been aware that she was thinking about using cocaine. She does not currently go to any kind of outpatient treatment. Her psychiatrist moved to [facility] and she has not been to see him in months. She had stopped taking her psychiatric medicine about a month ago. She has not been going to NA meetings because she claims the ones in her area stopped meeting. She does not report any particular new stress prior to the relapse on cocaine.   PAST PSYCHIATRIC HISTORY: Long history of mood disorder with mood swings, depression, and irritability. Has been diagnosed with bipolar  disorder type II rapid cycling in the past. Also diagnosed in the past with borderline personality disorder and has a long history of cocaine dependence. She has made serious suicide attempts in the past. She has had hospitalizations here and at other psychiatric facilities. She had most recently been on a combination of Abilify and Viibryd from her outpatient psychiatrist as well as Ambien and possibly clonazepam. She stopped taking all of her medicine about a month ago when her prescriptions ran out and she could not get the transportation to see her psychiatrist anymore. The patient says that the past lithium had been very helpful for her but she was taken off of it earlier this year because of an overdose attempt.   SUBSTANCE ABUSE HISTORY: Long history of cocaine dependence primarily also occasionally other drugs, including marijuana. For the past six months she claims she had been sober. She initially did it because she was afraid of losing her family. She went to meetings for a while, but stopped going recently.   PAST MEDICAL HISTORY: The patient is overweight and has multiple medical problems including diabetes on insulin, seizure disorder, anemia, chronic obstructive pulmonary disease, gastric reflux symptoms , and hypertension.   CURRENT MEDICATIONS: See medical admission for current full list but prior to stopping she was taking Abilify 10 mg a day, Viibryd probably at 40 mg a day, Ambien 10 mg at night, and probably Klonopin 1 mg twice a day.  ALLERGIES: Sulfa drugs.   REVIEW OF SYSTEMS: The patient reports feeling very sad and having positive suicidal ideation. She feels tired and upset. Does not have any other new physical symptoms. The rest of the review of systems negative.   MENTAL STATUS EXAM: Interviewed in the Emergency Room. She was lying down. She was cooperative with the interview. She made minimal eye contact. Psychomotor activity very limited. Affect was tearful and sobbing  throughout much of the interview. Mood was stated as very sad and depressed. Speech was normal in rate and tone. Thoughts appear to be lucid. No evidence of bizarre or psychotic thinking. Denies hallucinations. Positive suicidal ideation and continued wish to die. No homicidal ideation. Partial insight. Currently impaired judgment.   ASSESSMENT: 49 year old woman with cocaine dependence and bipolar disorder/borderline personality disorder/recurrent depression. Made a serious suicide attempt. Still has suicidal ideation and wishes. She is being admitted to the medical floor. I agree with the plan to have a sitter with her for the time being. As far as psychiatric medicine, I am going to start her back on the Viibryd at 40 mg a day as well as continuing the Abilify, putting her on the Ambien 10 mg at night and providing Ativan p.r.n. for anxiety or agitation. We might want to consider lithium later but until she is medically a little more stable I will put that off. Over the weekend I would recommend just stabilizing her and keeping her calm. Social work may want to go ahead and meet with her and possibly talk to the family, especially the husband to see where he is at as far as his thinking. After the weekend, I will continue to follow up with her. If you need a psychiatrist to see her face to face in the next couple of days, you can call Dr. Franchot Mimes.    DIAGNOSES PRINCIPLE AND PRIMARY:  AXIS I: Bipolar disorder type II, severely depressed.   SECONDARY DIAGNOSES:  AXIS I: Cocaine dependence.   AXIS II: Borderline personality traits versus disorder.   AXIS III: Diabetes, obesity, insulin overdose, chronic obstructive pulmonary disease, hypertension, gastric reflux, seizure disorder.   AXIS IV: Severe acute stress from the conflict with her husband and the relapse on drugs and the suicide attempt.   AXIS V: At the time of admission 20.   ____________________________ Gonzella Lex,  MD jtc:rbg D: 07/14/2011 14:00:01 ET T: 07/14/2011 15:36:05 ET JOB#: [id number]  cc: Gonzella Lex, MD, <Dictator> Gonzella Lex MD ELECTRONICALLY SIGNED 07/14/2011 15:57

## 2014-10-25 NOTE — Consult Note (Signed)
Brief Consult Note: Diagnosis: bipolar disorder type 2, cocaine dependence.   Patient was seen by consultant.   Consult note dictated.   Recommend further assessment or treatment.   Orders entered.   Discussed with Attending MD.   Comments: Psychiatry: Patient seen. Patient with past history of cocaine dependence and mood lability possible bipolar 2 who tried to kill herself via overdose of insulin. She relapsed on cocaine yesterday and her husband told her he was through with her,which prompted the suicide attempt. Currently still endorses SI though she is also cooperative with treatment. Tearful. Feeling hopeless.  Plan : I agree that a sitter is indicated in this situation. Her outpt meds recently were abilify, viibryd, ambien and maybe a benzo though she has not been recently complient. We will restart the viibryd and abilify and ambien and I'll provide prn ativan though ultimately it would be better to limit benzos. Will follow but if you need psych reeval face to face over the weekend call Dr Franchot Mimes.  Electronic Signatures: Gonzella Lex (MD)  (Signed 11-Jan-13 13:51)  Authored: Brief Consult Note   Last Updated: 11-Jan-13 13:51 by Gonzella Lex (MD)

## 2014-10-25 NOTE — Consult Note (Signed)
PATIENT NAME:  Kelsey Sanchez, TIPPING MR#:  [id number] DATE OF BIRTH:  1966-01-29  DATE OF CONSULTATION:  07/26/2011  REFERRING PHYSICIAN:  Dr. Noemi Chapel  CONSULTING PHYSICIAN:  Drue Stager. [place], MD  CHIEF COMPLAINT/REASON FOR CONSULTATION: Severe depressed mood and cocaine relapse.   HISTORY OF PRESENT ILLNESS: [name] is a 49 year old female presenting to the Emergency Department of [facility] with severe depressed mood and suicidal ideation. She has much shame regarding her relapse on alcohol. She has had statements from her husband that he is done with her regarding her cocaine. She has made statements of suicidal ideation, however, at the time of the undersigned examination she states that these are not strong impulses. She is highly motivated for rehabilitation and realizes that she must continue to be motivated for rehabilitation for her benefit and not her husbands.   She does not have suicidal thoughts at this time. She agrees to contract for safety inside a healthcare institution and agrees to call the staff for any thoughts of harming herself, thoughts of harming others or distress.   Her memory and orientation function are intact. She does acknowledge that her life has become shambles because of cocaine dependence. She denies that she has ever gone through a residential substance dependence treatment program. She points out that her significant other has not made an effort to learn more about addiction and attend Al-Anon groups.   She has no hallucinations or delusions.   She resides at home with her significant other. There is no abuse in this situation. She is medically disabled, unemployed and was formally a <<MISSING TEXT>> (dictation cut off here)  ____________________________ Drue Stager. Ader Fritze, MD jsw:cms D: 07/26/2011 23:59:54 ET T: 07/27/2011 07:01:00 ET  JOB#: [id number] cc: Drue Stager. Zenaya Ulatowski, MD, <Dictator>

## 2014-11-01 NOTE — Consult Note (Signed)
PATIENT NAME:  Kelsey Sanchez, CHAMBLEE MR#:  [id number] DATE OF BIRTH:  [birth year]  DATE OF CONSULTATION:  10/03/2014  CONSULTING PHYSICIAN:  Senie Lanese K. Franchot Mimes, MD  PLACE OF DICTATION: [place], room [address], [place], [place].  AGE: 49 years.   SEX: Female.  SUBJECTIVE: The patient is a 49 year old female who is married and is not employed and is on disability, and lives with her husband. The patient has a long history of bipolar disorder and being followed by Dr. Kasandra Knudsen at [facility]. Her last appointment was canceled and next appointment is scheduled in April 2016 and she plans to keep it. According to information obtained, the patient took a drug overdose. The patient reports that she did not want to hurt herself. By mistake she took too many of baclofen pills and then she called for help and came to here.   CHIEF COMPLAINT: "Can I go home because I'm not suicidal."  PAST PSYCHIATRIC HISTORY: History of inpatient on psychiatry on 3 occasions before. One of inpatient hospitalizations was at [facility]. History of suicide attempt by overdose on pills on one occasion. She took 300 pills.   ALCOHOL AND DRUGS: Does drink occasional alcohol. Admits that she smokes THC occasionally. Smoked cocaine about 3 months ago.   MENTAL STATUS EXAMINATION: The patient is alert and oriented, calm, and cooperative, no agitation. Mood and affect stable. Denies feeling depressed. Denies feeling hopeless or helpless. No psychosis. Does not appear to be responding to internal stimuli. Cognition intact. General fund of information fair. Insight and judgment fair. Absolutely denies any ideas of plans of to hurt herself and is eager to go home.   IMPRESSION: Bipolar disorder, depressed, but currently stable and contracts for safety.   RECOMMENDATIONS: Discontinue involuntary commitment, which I did. Discontinue suicidal precautions, which I did. Recommend the patient be discharged home as soon as  she is medically cleared and stable, and has followup appointment at [facility] with Dr. Kasandra Knudsen, and she will keep up with the same.       ____________________________ Wallace Cullens. Franchot Mimes, MD skc:TT D: 10/03/2014 70:48:88 ET T: 10/04/2014 00:41:03 ET JOB#: [id number]  cc: Arlyn Leak K. Franchot Mimes, MD, <Dictator> Dewain Penning MD ELECTRONICALLY SIGNED 10/10/2014 15:31

## 2014-11-01 NOTE — Discharge Summary (Signed)
PATIENT NAME:  Kelsey Sanchez, Kelsey Sanchez MR#:  [id number] DATE OF BIRTH:  1966/01/25  DATE OF ADMISSION:  10/02/2014 DATE OF DISCHARGE:  10/03/2014  PRIMARY CARE PHYSICIAN: Dr. Luan Pulling   FINAL DIAGNOSES:  1.  Acute encephalopathy, questionable overdose, bipolar disorder.  2.  Seizure disorder.  3.  Renal insufficiency.  4.  Sleep apnea.  5.  Back pain.  6.  Diabetes, type 2.  7.  Hypertension, essential.   HOSPITAL COURSE: The patient was admitted October 02, 2014 and signed out against medical advice October 03, 2014. The patient was admitted with altered mental status as per the admitting physician husband found her confused, walking around aimlessly. EMS brought her in. The patient was altered, confused, incoherent conversation. The patient was admitted with acute encephalopathy, questionable overdose, acute kidney injury and was given IV fluid hydration. The patient had leukocytosis. The patient was seen by me on April 2nd. Could not give me any history. I was unable to reach the husband, unable to get much history about what happened. Dr. Franchot Mimes saw the patient in consultation from psychiatry standpoint and discontinued involuntary commitment with followup with [facility], Dr. Kasandra Knudsen. Dr. Franchot Mimes discontinued the involuntary commitment on the evening of April 2nd. The patient signed out against medical advice on the evening of April 2nd.   DIAGNOSTIC DATA DURING HOSPITAL COURSE: Included a CPK of 182, glucose 219, BUN 39, creatinine 2.14, sodium 135, potassium 3.9, chloride 103, CO2 23, calcium 8.9. Liver function tests normal range. White blood cell count 16.6, H and H 11 and 35, platelet count 495,000. Urine toxicology positive for opiates, positive for cannabis, positive for benzodiazepine. Urinalysis negative. Lactic acid 1.4.   Chest x-ray: Vascular congestion, borderline cardiomegaly. Lungs remained grossly clear.   Next 2 troponins were negative. Hemoglobin A1c 8.1. Next creatinine 1.26.  Next white count 13.6, hemoglobin 10.5, and platelet count 450,000.   Lumbar spine x-ray: No fracture or acute finding.  Again, the patient signed out against medical advice once the patient was cleared from a psychiatric standpoint an involuntary commitment was discontinued. This happened on the evening of October 03, 2014. Unclear etiology of what caused her altered mental status at this point.  TIME SPENT: On case, 35 minutes, on April 2nd.  ____________________________ Tana Conch. Leslye Peer, MD rjw:sb D: 10/04/2014 15:38:29 ET T: 10/05/2014 08:05:40 ET JOB#: [id number]  cc: Tana Conch. Leslye Peer, MD, <Dictator> Dr. Colen Darling MD ELECTRONICALLY SIGNED 10/08/2014 15:39

## 2014-11-01 NOTE — H&P (Signed)
PATIENT NAME:  Kelsey Sanchez, Kelsey Sanchez MR#:  [id number] DATE OF BIRTH:  [birth year]  DATE OF ADMISSION:  10/02/2014  CHIEF COMPLAINT: Altered mental status.   HISTORY OF PRESENT ILLNESS: This is a 49 year old female who lives at home with her husband and was brought in tonight by EMS after her husband found her confused at home, walking around [place]. EMS brought her in. This patient has known significant psychiatric history with prior psychiatric  admissions here. The patient takes benzodiazepines as part of her medications although she did not have any obviously missing pills from her pill bottles.   In the ED here she was found to be altered, confused, able to converse but not able to carry on a very clear and coherent conversation to any degree. She was found to have an elevated white count of 16.6. She was benzodiazepine, cannabinol, and opiate positive on urine toxicology. She also had some acute kidney injury with some dehydration and hospitalists were called for admission. There was some concern for benzodiazepine overdose although again with no obvious pills missing from her pill bottle at home and the patient was respiratorily stable.   On interview the patient was not able to participate very much in the exam. She was able to converse some, but she just kept repeating that she did not feel good and she could not elaborate on this at all, and she was able to follow some commands but could not give information for example about her husband's phone number. Tried  to contact husband and family with contact numbers in the chart and those numbers were disconnected.   PRIMARY MEDICAL DOCTOR: Arlis Porta., MD    PRIMARY PSYCHIATRIST: Dr. Kasandra Knudsen, unsure about spelling. The patient stated that this is Dr. Kasandra Knudsen, a psychiatrist who practices in [place].   PAST MEDICAL HISTORY: Hepatitis C, pulmonary embolus, agoraphobia, bipolar, asthma, seizures, fibromyalgia, diabetes mellitus with neuropathy,  hypertension.   CURRENT MEDICATIONS: Per chart review as the patient could not confirm these, Protonix 20 mg daily, Lantus 60 units b.i.d., Keppra 500 mg b.i.d., Januvia 25 mg daily, hydrochlorothiazide 25 mg daily, glimepiride 4 mg daily, enalapril 20 mg b.i.d., Brintellix 20 mg daily, baclofen 10 mg t.i.d., Ativan 2 mg t.i.d.   PAST SURGICAL HISTORY: Right knee surgery, tubal ligation, and carpal tunnel surgery.   ALLERGIES: INCLUDE SULFA DRUGS.   FAMILY HISTORY: The patient was unable to give a family history.   SOCIAL HISTORY: The patient states that she smoked 1 pack per day for a long time. Does not use alcohol but does use some illicit drugs including marijuana. She could not elaborate more than this. She lives at home with her husband.   REVIEW OF SYSTEMS: Limited due to the patient's condition; however:   CONSTITUTIONAL: She denies fever or weakness or fatigue.  EYES: She denies blurred or double vision, pain or redness.  EAR, NOSE, AND THROAT: Denies ear pain, hearing loss, difficulty swallowing.  RESPIRATORY: Denies cough, wheeze, dyspnea.  CARDIOVASCULAR: Denies chest pain, edema, palpitations.  GASTROINTESTINAL: Denies nausea, vomiting, diarrhea, abdominal pain, constipation.  GENITOURINARY: Denies dysuria, hematuria, or frequency.  ENDOCRINE: Denies nocturia, thyroid problems, heat or cold intolerance.  HEMATOLOGIC AND LYMPHATIC: Denies easy bruising, bleeding, swollen glands.  INTEGUMENTARY: Denies acne, rash, or lesion.  MUSCULOSKELETAL: Denies acute arthritis, joint swelling, or gout.  NEUROLOGICAL: Denies numbness, weakness, or headache.  PSYCHIATRIC: Denies anxiety, insomnia, depression; however, she is not able to elaborate, and as I said this review of systems is limited  given the patient's state of mind.   PHYSICAL EXAMINATION:  CONSTITUTIONAL, VITAL SIGNS: Blood pressure 102/84, pulse 66, temperature 98.7, respiratory rate 20 with 100% O2 saturations on room air.   GENERAL: This is an obese female who is significantly emotionally distressed lying in bed.  HEENT: Pupils equal, round, react to light and accommodation. Extraocular movements intact. No scleral icterus. Dry mucosal membranes.  NECK: Thyroid is not enlarged. Neck is supple. No masses, nontender. No cervical adenopathy. No JVD.  RESPIRATORY: Clear to auscultation bilaterally with no rales, rhonchi, or wheezes. No respiratory distress.  CARDIOVASCULAR: Regular rate and rhythm. No murmur, rubs, or gallops on auscultation. Good pedal pulses with no lower extremity edema.  ABDOMEN: Soft, nontender, nondistended with good bowel sounds.  MUSCULOSKELETAL: Muscular strength 5/5 in all 4 extremities with full spontaneous range of motion throughout. No cyanosis or clubbing.  SKIN: No rash or lesions. Skin is warm, dry, and intact.  LYMPHATIC: No adenopathy.  NEUROLOGICAL: Cranial nerves intact. Sensation intact throughout. No dysarthria or aphasia.  PSYCHIATRIC: The patient is alert, is oriented to person and place only. She is somewhat confused. She is not communicating entirely coherently. She is moderately cooperative. She is very tearful.   LABORATORY DATA: White count 16.6, hemoglobin 11, hematocrit 35.4, platelet count 495,000, sodium 135, potassium 3.9, chloride 103, bicarbonate 23, BUN 39, creatinine 2.14, glucose 219, total protein 7.5, albumin 3.2, bilirubin 0.2, alkaline phosphatase 90, AST 22, ALT 24. Her lactic acid was 1.4. Her troponin was less than 0.03. Her UA was negative. Her urine toxicology as listed in HPI.   RADIOLOGY: Chest x-ray showed vascular congestion and borderline cardiomegaly with grossly clear lungs. No evidence of opacification, effusion, or pneumothorax   ASSESSMENT AND PLAN:  1.  Acute encephalopathy. The patient is encephalopathic from at this time unclear cause although drug overdose is not completely ruled out. No obviously missing pills from her bottles at home,  however, she was opiate positive and does not have opiates listed on her medication list and she also was marijuana positive so unclear whether or not this patient may have overdosed on something. We will continue the workup here for her encephalopathy and treat her other problems as below as this is likely related to an intoxication problem.    2.  Drug overdose. Given her positive toxicology screen with medications that she is not prescribed, this is the likely cause of her encephalopathy. She is respiratorily stable right now. We will continue to monitor her. We will get a psychiatric consult, keep her n.p.o. for now. We will have her on CIWA protocol just to monitor for any kind of benzodiazepine withdrawal.  3.  Acute kidney injury. We will hydrate this patient. Her acute kidney injury is probably from her dehydration. Unclear cause of the dehydration at this time. We will hydrate her, follow her creatinine, and avoid nephrotoxic medications.  4.  Elevated white blood cell count.  This may very well be from her dehydration although unclear at this time if she also might have some infection going on. Chest x-ray did not show any type of pneumonia nor did physical exam elicit any findings consistent with pneumonia. Her UA was negative. We will continue to monitor her for now. Her white blood cell count elevation could potentially be due to her dehydration and hemoconcentration as well given her elevated platelet count too.   5.  Bipolar disorder. Unclear if depression-related bipolar disorder may be the cause of this potential drug overdose at  this time. Unclear whether or not this is a suicide attempt. We will get psychiatry involved to help manage this patient's psychiatric conditions.  6.  Diabetes mellitus. The patient had some hyperglycemia today although it is not excessive and we will keep her on sliding scale insulin and check a hemoglobin A1c.  7.  Hypertension. The patient's blood pressure is  stable at this time. We will hold her home enalapril for now given her acute kidney injury as well as her hydrochlorothiazide will be held for now and we will monitor her blood pressure and use IV p.r.n. medications as needed to keep her blood pressure at goal.  8.  Deep vein thrombosis prophylaxis with subcutaneous heparin.   CODE STATUS: Will need to be addressed with family or with the patient when she becomes more capable of holding this conversation.   TIME SPENT ON THIS ADMISSION: 50 minutes.    ____________________________ Wilford Corner. Jannifer Franklin, MD dfw:AT D: 10/03/2014 01:48:28 ET T: 10/03/2014 02:08:48 ET JOB#: [id number]  cc: Wilford Corner. Jannifer Franklin, MD, <Dictator> Jakevious Hollister Fawn Kirk MD ELECTRONICALLY SIGNED 10/03/2014 3:08

## 2014-11-09 DIAGNOSIS — F410 Panic disorder [episodic paroxysmal anxiety] without agoraphobia: Secondary | ICD-10-CM | POA: Diagnosis not present

## 2014-11-09 DIAGNOSIS — Z9189 Other specified personal risk factors, not elsewhere classified: Secondary | ICD-10-CM | POA: Diagnosis not present

## 2014-11-09 DIAGNOSIS — E780 Pure hypercholesterolemia: Secondary | ICD-10-CM | POA: Diagnosis not present

## 2014-11-09 DIAGNOSIS — Z79899 Other long term (current) drug therapy: Secondary | ICD-10-CM | POA: Diagnosis not present

## 2014-11-09 DIAGNOSIS — E669 Obesity, unspecified: Secondary | ICD-10-CM | POA: Diagnosis not present

## 2014-11-09 DIAGNOSIS — F3132 Bipolar disorder, current episode depressed, moderate: Secondary | ICD-10-CM | POA: Diagnosis not present

## 2014-11-09 DIAGNOSIS — E119 Type 2 diabetes mellitus without complications: Secondary | ICD-10-CM | POA: Diagnosis not present

## 2014-11-09 DIAGNOSIS — I10 Essential (primary) hypertension: Secondary | ICD-10-CM | POA: Diagnosis not present

## 2014-11-10 DIAGNOSIS — J449 Chronic obstructive pulmonary disease, unspecified: Secondary | ICD-10-CM | POA: Diagnosis not present

## 2014-11-18 DIAGNOSIS — E1140 Type 2 diabetes mellitus with diabetic neuropathy, unspecified: Secondary | ICD-10-CM | POA: Diagnosis not present

## 2014-11-18 DIAGNOSIS — Z7951 Long term (current) use of inhaled steroids: Secondary | ICD-10-CM | POA: Diagnosis not present

## 2014-11-18 DIAGNOSIS — F319 Bipolar disorder, unspecified: Secondary | ICD-10-CM | POA: Diagnosis not present

## 2014-11-18 DIAGNOSIS — G40009 Localization-related (focal) (partial) idiopathic epilepsy and epileptic syndromes with seizures of localized onset, not intractable, without status epilepticus: Secondary | ICD-10-CM | POA: Diagnosis not present

## 2014-11-18 DIAGNOSIS — Z79899 Other long term (current) drug therapy: Secondary | ICD-10-CM | POA: Diagnosis not present

## 2014-11-18 DIAGNOSIS — Z794 Long term (current) use of insulin: Secondary | ICD-10-CM | POA: Diagnosis not present

## 2014-11-18 DIAGNOSIS — F17210 Nicotine dependence, cigarettes, uncomplicated: Secondary | ICD-10-CM | POA: Diagnosis not present

## 2014-11-18 DIAGNOSIS — M797 Fibromyalgia: Secondary | ICD-10-CM | POA: Diagnosis not present

## 2014-11-18 DIAGNOSIS — F603 Borderline personality disorder: Secondary | ICD-10-CM | POA: Diagnosis not present

## 2014-11-18 DIAGNOSIS — G40909 Epilepsy, unspecified, not intractable, without status epilepticus: Secondary | ICD-10-CM | POA: Diagnosis not present

## 2014-11-23 DIAGNOSIS — G4733 Obstructive sleep apnea (adult) (pediatric): Secondary | ICD-10-CM | POA: Diagnosis not present

## 2014-11-30 DIAGNOSIS — G4733 Obstructive sleep apnea (adult) (pediatric): Secondary | ICD-10-CM | POA: Diagnosis not present

## 2014-11-30 DIAGNOSIS — G4737 Central sleep apnea in conditions classified elsewhere: Secondary | ICD-10-CM | POA: Diagnosis not present

## 2014-12-08 DIAGNOSIS — E119 Type 2 diabetes mellitus without complications: Secondary | ICD-10-CM | POA: Diagnosis not present

## 2014-12-11 DIAGNOSIS — J449 Chronic obstructive pulmonary disease, unspecified: Secondary | ICD-10-CM | POA: Diagnosis not present

## 2014-12-22 ENCOUNTER — Other Ambulatory Visit: Payer: Self-pay | Admitting: Family Medicine

## 2014-12-22 DIAGNOSIS — H52223 Regular astigmatism, bilateral: Secondary | ICD-10-CM | POA: Diagnosis not present

## 2014-12-22 DIAGNOSIS — D3132 Benign neoplasm of left choroid: Secondary | ICD-10-CM | POA: Diagnosis not present

## 2014-12-22 DIAGNOSIS — E119 Type 2 diabetes mellitus without complications: Secondary | ICD-10-CM | POA: Diagnosis not present

## 2014-12-22 MED ORDER — ONDANSETRON HCL 4 MG PO TABS: 4.0000 mg | ORAL_TABLET | Freq: Three times a day (TID) | ORAL | Status: AC | PRN

## 2014-12-30 DIAGNOSIS — G4733 Obstructive sleep apnea (adult) (pediatric): Secondary | ICD-10-CM | POA: Diagnosis not present

## 2014-12-30 DIAGNOSIS — G4737 Central sleep apnea in conditions classified elsewhere: Secondary | ICD-10-CM | POA: Diagnosis not present

## 2015-01-05 ENCOUNTER — Telehealth: Payer: Self-pay | Admitting: Family Medicine

## 2015-01-05 NOTE — Telephone Encounter (Signed)
Luvenia Starch, nurse at [facility] requested pt's last A1C, BP and that date.  Please call [phone] press [phone] and extension [phone] or fax [phone].

## 2015-01-06 NOTE — Telephone Encounter (Signed)
Done/JH 

## 2015-01-10 DIAGNOSIS — J449 Chronic obstructive pulmonary disease, unspecified: Secondary | ICD-10-CM | POA: Diagnosis not present

## 2015-01-29 DIAGNOSIS — G4737 Central sleep apnea in conditions classified elsewhere: Secondary | ICD-10-CM | POA: Diagnosis not present

## 2015-01-29 DIAGNOSIS — G4733 Obstructive sleep apnea (adult) (pediatric): Secondary | ICD-10-CM | POA: Diagnosis not present

## 2015-02-01 ENCOUNTER — Telehealth: Payer: Self-pay | Admitting: Family Medicine

## 2015-02-01 NOTE — Telephone Encounter (Signed)
The mouth piece on pt's nebulizer has broken and she can't use it.  [facility] said she needed to have a prescription for a universal nebulizer kit.  She really needs this as soon as possible.  Please call pt [phone] when this has been sent.. °

## 2015-02-01 NOTE — Telephone Encounter (Signed)
Called this in at 12:00. [facility]

## 2015-02-01 NOTE — Telephone Encounter (Signed)
OK to order mouth piece kit, but remind patient that she must be seen in office to get any more refills of anything since it has been so long since she was seen.-jh

## 2015-02-01 NOTE — Telephone Encounter (Signed)
Advised patient to come in and be seen after she said she is very sick and must have Rx for mouth piece ASAP. She said she asked Pulmonology as she sees [facility] for breathing issues associated with asthma. She has the [place] [employer] solution and can not use till the mouth piece is replaced. I asked her to be seen since we have not seen her about Neb and she said she can not afford copay. She said pharmacy [facility] will give this to her if we call it in. She said she can not come due to not having two dollars to her name and a few minutes later I asked if she has to pay for her Rx and she said 10.00 copays. Please let me know what you think.[facility]

## 2015-02-08 ENCOUNTER — Ambulatory Visit: Payer: Self-pay | Admitting: Family Medicine

## 2015-02-10 DIAGNOSIS — J449 Chronic obstructive pulmonary disease, unspecified: Secondary | ICD-10-CM | POA: Diagnosis not present

## 2015-02-22 ENCOUNTER — Other Ambulatory Visit: Payer: Self-pay | Admitting: Family Medicine

## 2015-02-23 ENCOUNTER — Telehealth: Payer: Self-pay

## 2015-02-23 NOTE — Telephone Encounter (Signed)
Amy Krebs spoke with [employer] and did verify that this patient was found in home dead on 2022/10/30 2015-03-22. [facility]

## 2015-02-24 ENCOUNTER — Ambulatory Visit: Payer: Self-pay | Admitting: Family Medicine

## 2015-03-02 DIAGNOSIS — G4733 Obstructive sleep apnea (adult) (pediatric): Secondary | ICD-10-CM | POA: Diagnosis not present

## 2015-03-02 DIAGNOSIS — G4737 Central sleep apnea in conditions classified elsewhere: Secondary | ICD-10-CM | POA: Diagnosis not present

## 2015-03-04 DEATH — deceased

## 2015-09-05 IMAGING — CR DG LUMBAR SPINE 2-3V
1 series · 3 of 3 positions shown · non-contrast
Comparison: CT, 08/28/2014

CLINICAL DATA: Chronic general back pain with no known injury.

EXAM:
LUMBAR SPINE - 2-3 VIEW

[Series 1: dxr lumbar spine ap and lateral · 0.14mm/px · 3 of 3 slices shown]
[im 1/3]
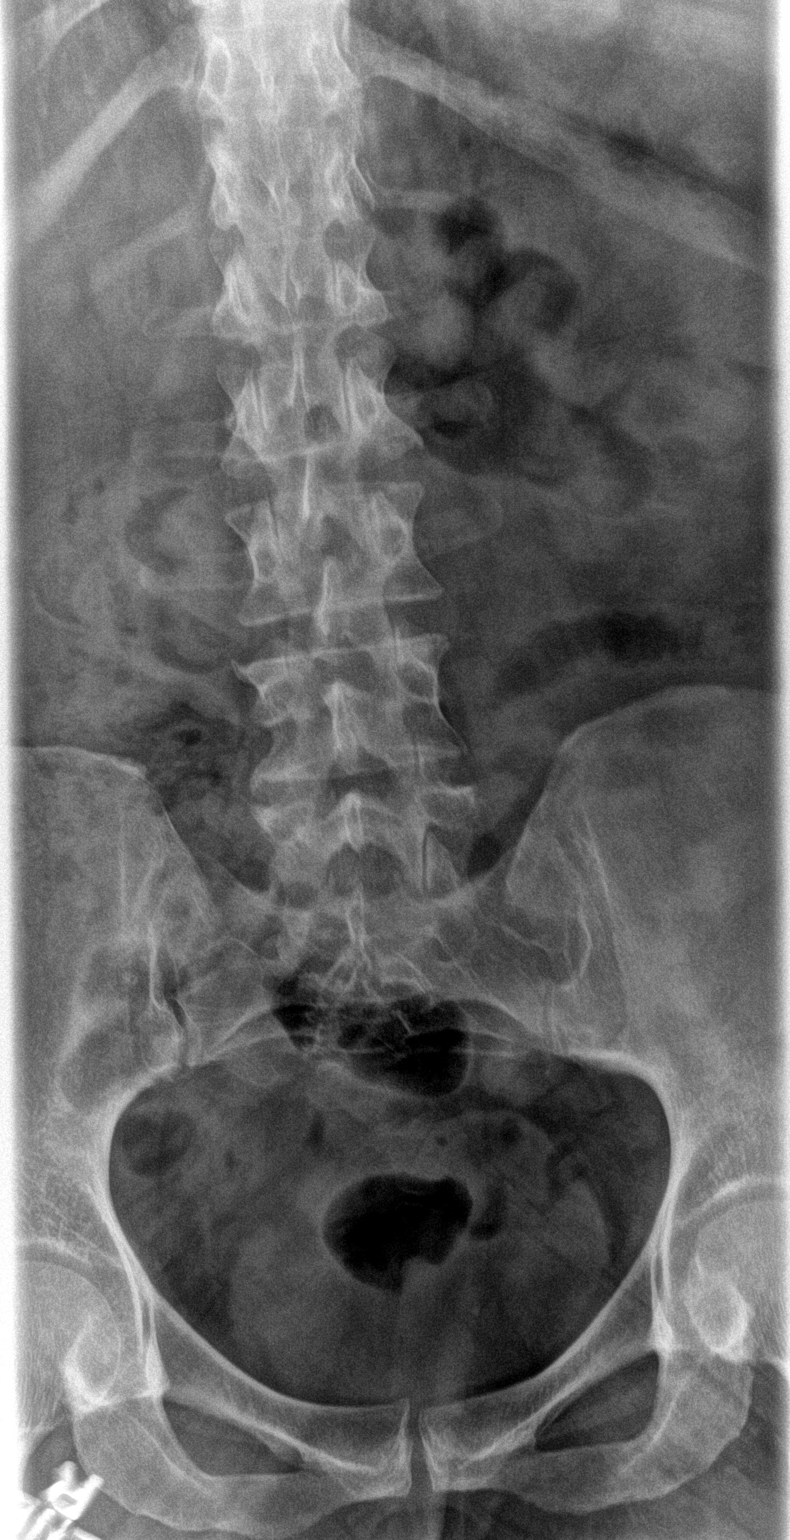
[im 2/3]
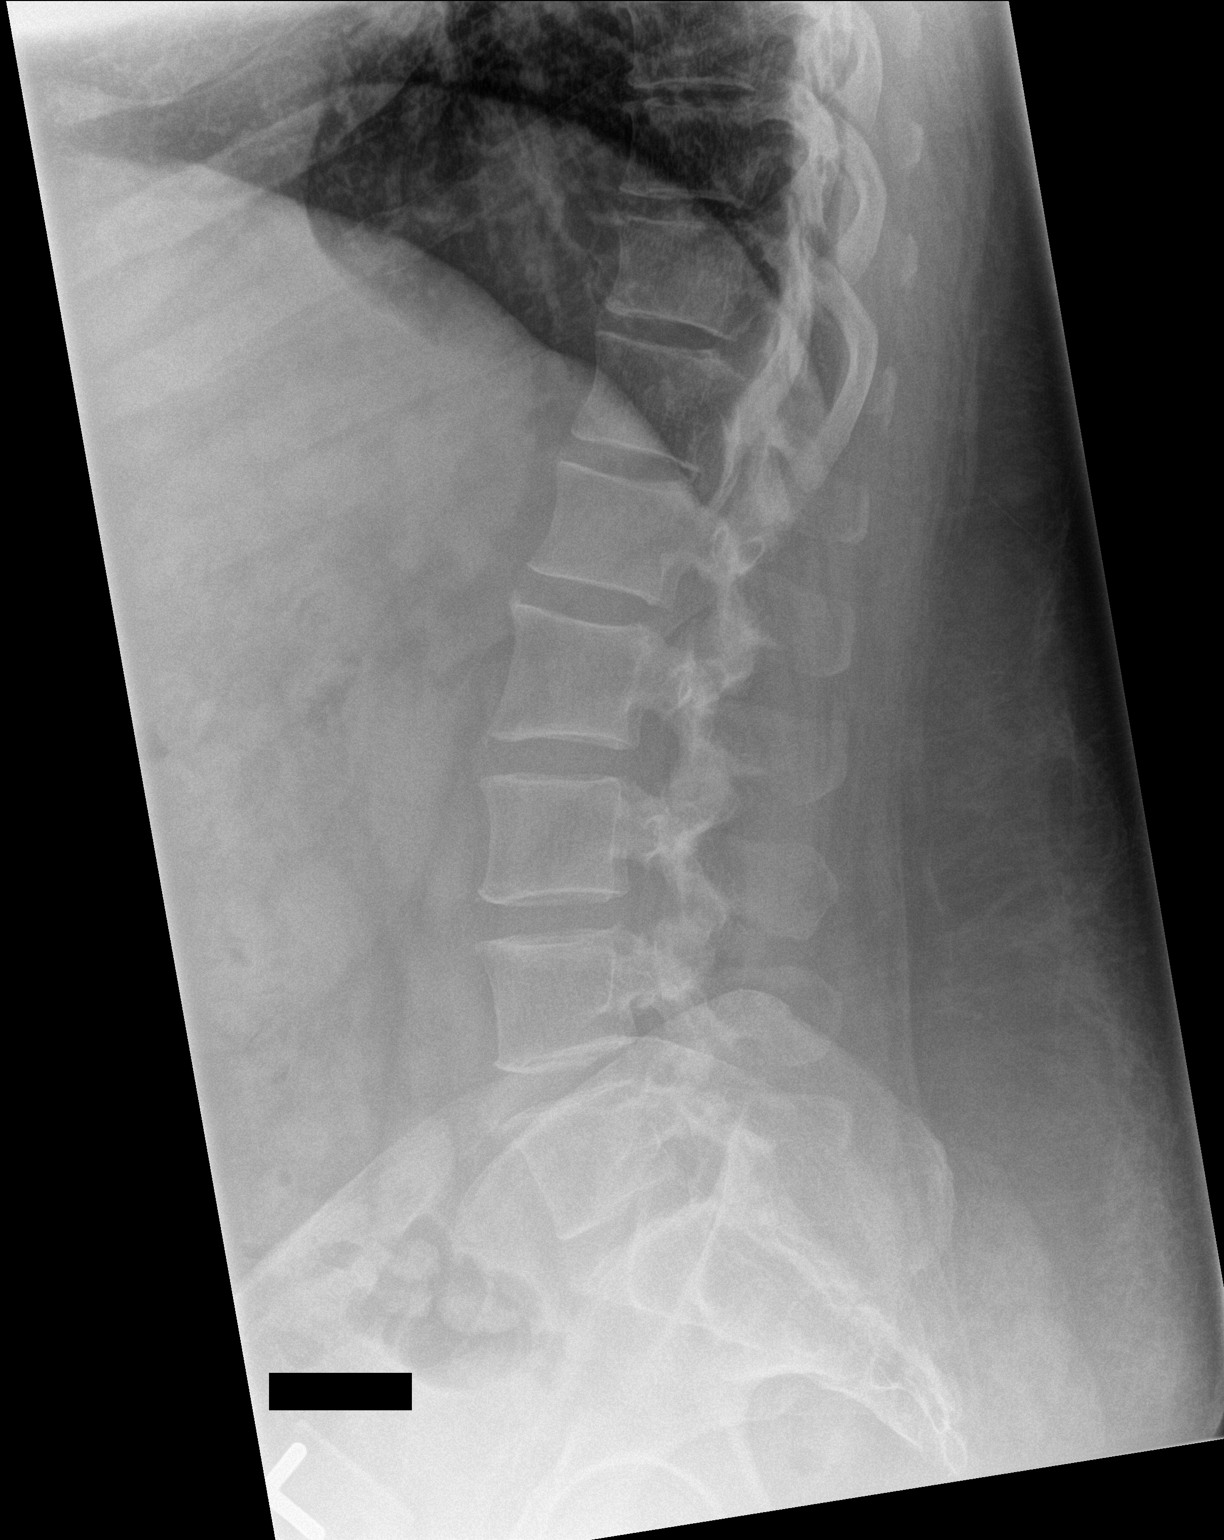
[im 3/3]
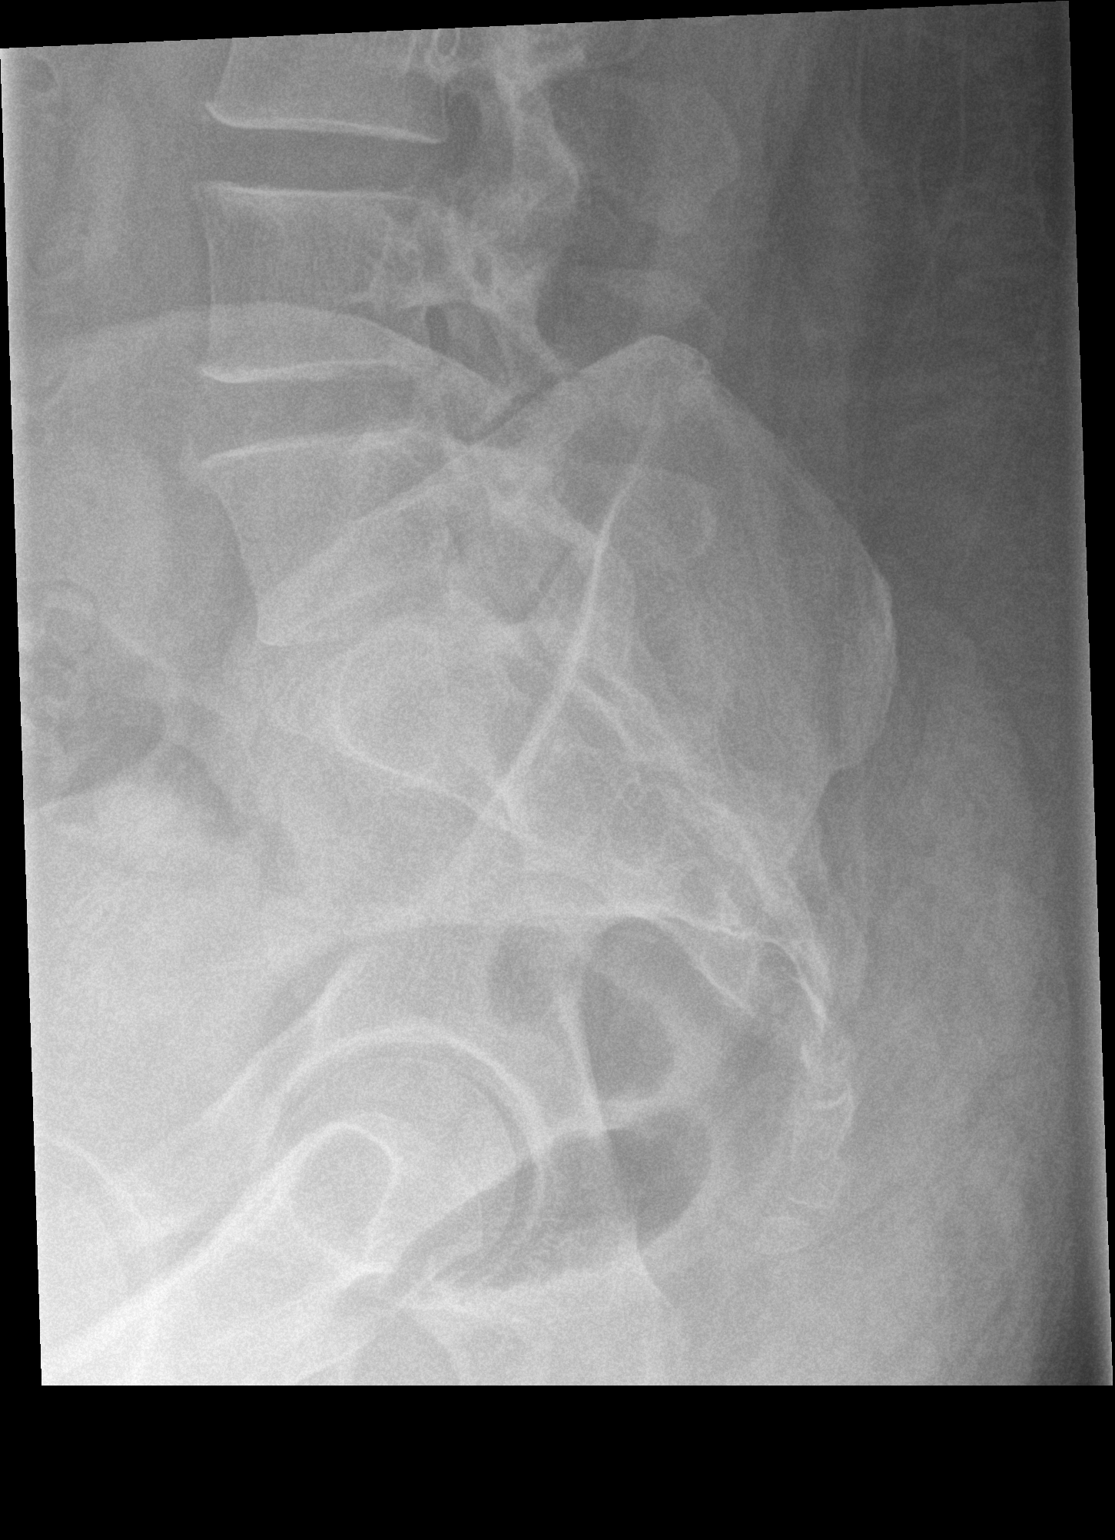

[3 of 3 positions shown; findings below may reference images not displayed]

FINDINGS: No fracture. No spondylolisthesis. The disc spaces are well
preserved. Minimal endplate osteophyte formation is noted most
evident at L4 on L5.

Soft tissues are unremarkable.
IMPRESSION: 1. No fracture or acute finding.  Minimal degenerative change.
